# Patient Record
Sex: Female | Born: 1960 | Race: White | Hispanic: No | Smoking: Current every day smoker
Health system: Southern US, Community
[De-identification: ages and names within clinical notes are randomized; demographics above are authoritative.]

## PROBLEM LIST (undated history)

## (undated) DIAGNOSIS — E785 Hyperlipidemia, unspecified: Secondary | ICD-10-CM

## (undated) DIAGNOSIS — R112 Nausea with vomiting, unspecified: Secondary | ICD-10-CM

## (undated) DIAGNOSIS — T8859XA Other complications of anesthesia, initial encounter: Secondary | ICD-10-CM

## (undated) DIAGNOSIS — C4492 Squamous cell carcinoma of skin, unspecified: Secondary | ICD-10-CM

## (undated) DIAGNOSIS — R000 Tachycardia, unspecified: Secondary | ICD-10-CM

## (undated) DIAGNOSIS — D649 Anemia, unspecified: Secondary | ICD-10-CM

## (undated) DIAGNOSIS — I10 Essential (primary) hypertension: Secondary | ICD-10-CM

## (undated) DIAGNOSIS — Z9889 Other specified postprocedural states: Secondary | ICD-10-CM

## (undated) HISTORY — DX: Squamous cell carcinoma of skin, unspecified: C44.92

## (undated) HISTORY — DX: Hyperlipidemia, unspecified: E78.5

## (undated) HISTORY — DX: Essential (primary) hypertension: I10

## (undated) HISTORY — PX: BREAST BIOPSY: SHX20

---

## 2002-11-29 DIAGNOSIS — C439 Malignant melanoma of skin, unspecified: Secondary | ICD-10-CM | POA: Insufficient documentation

## 2004-07-26 ENCOUNTER — Other Ambulatory Visit: Admission: RE | Admit: 2004-07-26 | Discharge: 2004-07-26 | Payer: Self-pay | Admitting: Dermatology

## 2004-09-27 ENCOUNTER — Other Ambulatory Visit: Admission: RE | Admit: 2004-09-27 | Discharge: 2004-09-27 | Payer: Self-pay | Admitting: Dermatology

## 2005-04-10 ENCOUNTER — Emergency Department (HOSPITAL_COMMUNITY): Admission: EM | Admit: 2005-04-10 | Discharge: 2005-04-10 | Payer: Self-pay | Admitting: Emergency Medicine

## 2007-01-09 ENCOUNTER — Ambulatory Visit (HOSPITAL_COMMUNITY): Payer: Self-pay | Admitting: Oncology

## 2007-01-09 ENCOUNTER — Emergency Department (HOSPITAL_COMMUNITY): Admission: EM | Admit: 2007-01-09 | Discharge: 2007-01-09 | Payer: Self-pay | Admitting: Emergency Medicine

## 2007-03-01 ENCOUNTER — Ambulatory Visit (HOSPITAL_COMMUNITY): Payer: Self-pay | Admitting: Oncology

## 2007-03-01 ENCOUNTER — Encounter (HOSPITAL_COMMUNITY): Admission: RE | Admit: 2007-03-01 | Discharge: 2007-03-27 | Payer: Self-pay | Admitting: Oncology

## 2007-09-06 ENCOUNTER — Ambulatory Visit (HOSPITAL_COMMUNITY): Payer: Self-pay | Admitting: Oncology

## 2007-09-06 ENCOUNTER — Encounter (HOSPITAL_COMMUNITY): Admission: RE | Admit: 2007-09-06 | Discharge: 2007-10-06 | Payer: Self-pay | Admitting: Oncology

## 2011-03-21 LAB — CBC
MCHC: 34.8
Platelets: 241
RDW: 15.3

## 2011-04-08 LAB — FERRITIN: Ferritin: 17 (ref 10–291)

## 2011-04-08 LAB — CBC
MCHC: 32.7
MCV: 87.1
Platelets: 236
RBC: 4.32
WBC: 11 — ABNORMAL HIGH

## 2011-04-11 LAB — DIFFERENTIAL
Basophils Absolute: 0
Basophils Relative: 0
Eosinophils Absolute: 0.1
Eosinophils Relative: 2
Lymphocytes Relative: 18
Lymphs Abs: 1
Monocytes Absolute: 0.4
Monocytes Relative: 8
Neutro Abs: 4.1
Neutrophils Relative %: 72

## 2011-04-11 LAB — COMPREHENSIVE METABOLIC PANEL
ALT: 12
AST: 18
Albumin: 3.9
Alkaline Phosphatase: 48
BUN: 9
CO2: 22
Calcium: 8.9
Chloride: 108
Creatinine, Ser: 0.66
GFR calc Af Amer: >60
GFR calc non Af Amer: >60
Glucose, Bld: 100 — ABNORMAL HIGH
Potassium: 4.2
Sodium: 138
Total Bilirubin: 0.4
Total Protein: 6.8

## 2011-04-11 LAB — URINE MICROSCOPIC-ADD ON

## 2011-04-11 LAB — URINALYSIS, ROUTINE W REFLEX MICROSCOPIC
Bilirubin Urine: NEGATIVE
Glucose, UA: NEGATIVE
Ketones, ur: NEGATIVE
Leukocytes, UA: NEGATIVE
Nitrite: NEGATIVE
Specific Gravity, Urine: 1.02
Urobilinogen, UA: 0.2
pH: 6.5

## 2011-04-11 LAB — IRON AND TIBC
Iron: 10 — ABNORMAL LOW
Saturation Ratios: 2 — ABNORMAL LOW

## 2011-04-11 LAB — FERRITIN: Ferritin: 4 — ABNORMAL LOW (ref 10–291)

## 2011-04-11 LAB — PREGNANCY, URINE: Preg Test, Ur: NEGATIVE

## 2011-04-11 LAB — CBC
HCT: 28.5 — ABNORMAL LOW
Hemoglobin: 8.7 — ABNORMAL LOW
Platelets: 247
RDW: 18.4 — ABNORMAL HIGH
WBC: 5.6

## 2011-04-11 LAB — LACTATE DEHYDROGENASE: LDH: 112

## 2021-03-05 ENCOUNTER — Other Ambulatory Visit: Payer: Self-pay | Admitting: Oculoplastics Ophthalmology

## 2021-03-05 DIAGNOSIS — H04412 Chronic dacryocystitis of left lacrimal passage: Secondary | ICD-10-CM

## 2021-03-18 ENCOUNTER — Ambulatory Visit: Payer: Self-pay

## 2021-05-07 ENCOUNTER — Ambulatory Visit (HOSPITAL_COMMUNITY)
Admission: RE | Admit: 2021-05-07 | Discharge: 2021-05-07 | Disposition: A | Discharge status: Home / Self Care | Payer: Managed Care, Other (non HMO) | Source: Ambulatory Visit | Attending: Dermatology | Admitting: Dermatology

## 2021-05-07 ENCOUNTER — Other Ambulatory Visit (HOSPITAL_COMMUNITY)
Admission: RE | Admit: 2021-05-07 | Discharge: 2021-05-07 | Disposition: A | Discharge status: Home / Self Care | Payer: Managed Care, Other (non HMO) | Source: Ambulatory Visit | Attending: Dermatology | Admitting: Dermatology

## 2021-05-07 ENCOUNTER — Other Ambulatory Visit: Payer: Self-pay

## 2021-05-07 ENCOUNTER — Other Ambulatory Visit (HOSPITAL_COMMUNITY): Payer: Self-pay | Admitting: Dermatology

## 2021-05-07 DIAGNOSIS — C44320 Squamous cell carcinoma of skin of unspecified parts of face: Secondary | ICD-10-CM | POA: Insufficient documentation

## 2021-05-17 DIAGNOSIS — C44329 Squamous cell carcinoma of skin of other parts of face: Secondary | ICD-10-CM | POA: Insufficient documentation

## 2021-05-17 DIAGNOSIS — C50911 Malignant neoplasm of unspecified site of right female breast: Secondary | ICD-10-CM | POA: Insufficient documentation

## 2021-05-25 ENCOUNTER — Other Ambulatory Visit: Payer: Self-pay | Admitting: Hematology and Oncology

## 2021-05-25 DIAGNOSIS — C50011 Malignant neoplasm of nipple and areola, right female breast: Secondary | ICD-10-CM

## 2021-05-31 ENCOUNTER — Other Ambulatory Visit: Payer: Self-pay | Admitting: *Deleted

## 2021-05-31 DIAGNOSIS — C50911 Malignant neoplasm of unspecified site of right female breast: Secondary | ICD-10-CM

## 2021-06-09 ENCOUNTER — Other Ambulatory Visit: Payer: Self-pay | Admitting: Hematology and Oncology

## 2021-06-09 ENCOUNTER — Other Ambulatory Visit: Payer: Self-pay | Admitting: *Deleted

## 2021-06-09 DIAGNOSIS — R928 Other abnormal and inconclusive findings on diagnostic imaging of breast: Secondary | ICD-10-CM

## 2021-06-15 ENCOUNTER — Ambulatory Visit: Payer: Managed Care, Other (non HMO) | Admitting: General Surgery

## 2021-06-23 ENCOUNTER — Other Ambulatory Visit: Payer: Self-pay | Admitting: Hematology and Oncology

## 2021-06-23 ENCOUNTER — Ambulatory Visit
Admission: RE | Admit: 2021-06-23 | Discharge: 2021-06-23 | Disposition: A | Discharge status: Home / Self Care | Payer: Managed Care, Other (non HMO) | Source: Ambulatory Visit | Attending: Hematology and Oncology | Admitting: Hematology and Oncology

## 2021-06-23 DIAGNOSIS — R928 Other abnormal and inconclusive findings on diagnostic imaging of breast: Secondary | ICD-10-CM

## 2021-07-01 ENCOUNTER — Encounter: Payer: Self-pay | Admitting: General Surgery

## 2021-07-01 ENCOUNTER — Other Ambulatory Visit: Payer: Self-pay

## 2021-07-01 ENCOUNTER — Ambulatory Visit: Payer: Managed Care, Other (non HMO) | Admitting: General Surgery

## 2021-07-01 VITALS — BP 139/59 | HR 69 | Temp 98.9°F | Resp 18 | Ht 67.0 in | Wt 148.0 lb

## 2021-07-01 DIAGNOSIS — Z170 Estrogen receptor positive status [ER+]: Secondary | ICD-10-CM | POA: Diagnosis not present

## 2021-07-01 DIAGNOSIS — C50411 Malignant neoplasm of upper-outer quadrant of right female breast: Secondary | ICD-10-CM | POA: Diagnosis not present

## 2021-07-02 NOTE — Progress Notes (Signed)
Kristin Villa; [id number]; 22-Aug-1960   HPI Patient is a 61 year old white female who was referred to my care by [name] for evaluation treatment of a newly diagnosed right breast cancer.  Patient noticed a lump in her right breast and core biopsies of both the right breast mass and a right axillary lymph node were positive for invasive mammary carcinoma, ER, PR positive.  Patient has had multiple right breast biopsies in the past.  She denies any family history of breast cancer.  She denies any nipple discharge. Past Medical History:  Diagnosis Date   Hyperlipidemia    Hypertension    SCC (squamous cell carcinoma)    face    Past Surgical History:  Procedure Laterality Date   BREAST BIOPSY Right    x3    Family History  Problem Relation Age of Onset   Dementia Father    Cancer Maternal Grandmother        colon   Cancer Maternal Grandfather        lung    Current Outpatient Medications on File Prior to Visit  Medication Sig Dispense Refill   metoprolol succinate (TOPROL-XL) 25 MG 24 hr tablet Take 25 mg by mouth daily.     rosuvastatin (CRESTOR) 5 MG tablet Take by mouth.     No current facility-administered medications on file prior to visit.    No Known Allergies  Social History   Substance and Sexual Activity  Alcohol Use Never    Social History   Tobacco Use  Smoking Status Every Day   Types: Cigarettes  Smokeless Tobacco Never    Review of Systems  Constitutional: Negative.   HENT: Negative.    Eyes: Negative.   Respiratory: Negative.    Cardiovascular: Negative.   Gastrointestinal: Negative.   Genitourinary: Negative.   Musculoskeletal: Negative.   Skin: Negative.   Neurological: Negative.   Endo/Heme/Allergies: Negative.   Psychiatric/Behavioral: Negative.     Objective   Vitals:   07/01/21 1133  BP: (!) 139/59  Pulse: 69  Resp: 18  Temp: 98.9 F (37.2 C)  SpO2: 96%    Physical Exam Vitals reviewed. Exam conducted with a  chaperone present.  Constitutional:      Appearance: Normal appearance. She is normal weight. She is not ill-appearing.  HENT:     Head: Normocephalic and atraumatic.  Cardiovascular:     Rate and Rhythm: Normal rate and regular rhythm.     Heart sounds: Normal heart sounds. No murmur heard.   No friction rub. No gallop.  Pulmonary:     Effort: Pulmonary effort is normal. No respiratory distress.     Breath sounds: Normal breath sounds. No stridor. No wheezing, rhonchi or rales.  Lymphadenopathy:     Cervical: No cervical adenopathy.  Skin:    General: Skin is warm and dry.  Neurological:     Mental Status: She is alert and oriented to person, place, and time.  Breast: Dominant 2 cm mass in the upper outer quadrant of the right breast with another easily palpable mass in the lower portion of the right axilla.  There is bilateral nipple retraction.  A large tattoo is noted in the upper portion of the right breast.  The left breast is without a dominant mass, nipple discharge, or axillary lymphadenopathy. 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  Patient states she is already decided to undergo right modified radical mastectomy.  The risks and benefits of the procedure including bleeding, infection, cardiopulmonary difficulties, and right arm swelling were fully explained to the patient, who gave informed consent.  Patient is scheduled for 07/16/2021.

## 2021-07-02 NOTE — H&P (Signed)
Kristin Villa; [id number]; 1960-12-21   HPI Patient is a 61 year old white female who was referred to my care by [name] for evaluation treatment of a newly diagnosed right breast cancer.  Patient noticed a lump in her right breast and core biopsies of both the right breast mass and a right axillary lymph node were positive for invasive mammary carcinoma, ER, PR positive.  Patient has had multiple right breast biopsies in the past.  She denies any family history of breast cancer.  She denies any nipple discharge. Past Medical History:  Diagnosis Date   Hyperlipidemia    Hypertension    SCC (squamous cell carcinoma)    face    Past Surgical History:  Procedure Laterality Date   BREAST BIOPSY Right    x3    Family History  Problem Relation Age of Onset   Dementia Father    Cancer Maternal Grandmother        colon   Cancer Maternal Grandfather        lung    Current Outpatient Medications on File Prior to Visit  Medication Sig Dispense Refill   metoprolol succinate (TOPROL-XL) 25 MG 24 hr tablet Take 25 mg by mouth daily.     rosuvastatin (CRESTOR) 5 MG tablet Take by mouth.     No current facility-administered medications on file prior to visit.    No Known Allergies  Social History   Substance and Sexual Activity  Alcohol Use Never    Social History   Tobacco Use  Smoking Status Every Day   Types: Cigarettes  Smokeless Tobacco Never    Review of Systems  Constitutional: Negative.   HENT: Negative.    Eyes: Negative.   Respiratory: Negative.    Cardiovascular: Negative.   Gastrointestinal: Negative.   Genitourinary: Negative.   Musculoskeletal: Negative.   Skin: Negative.   Neurological: Negative.   Endo/Heme/Allergies: Negative.   Psychiatric/Behavioral: Negative.     Objective   Vitals:   07/01/21 1133  BP: (!) 139/59  Pulse: 69  Resp: 18  Temp: 98.9 F (37.2 C)  SpO2: 96%    Physical Exam Vitals reviewed. Exam conducted with a  chaperone present.  Constitutional:      Appearance: Normal appearance. She is normal weight. She is not ill-appearing.  HENT:     Head: Normocephalic and atraumatic.  Cardiovascular:     Rate and Rhythm: Normal rate and regular rhythm.     Heart sounds: Normal heart sounds. No murmur heard.   No friction rub. No gallop.  Pulmonary:     Effort: Pulmonary effort is normal. No respiratory distress.     Breath sounds: Normal breath sounds. No stridor. No wheezing, rhonchi or rales.  Lymphadenopathy:     Cervical: No cervical adenopathy.  Skin:    General: Skin is warm and dry.  Neurological:     Mental Status: She is alert and oriented to person, place, and time.  Breast: Dominant 2 cm mass in the upper outer quadrant of the right breast with another easily palpable mass in the lower portion of the right axilla.  There is bilateral nipple retraction.  A large tattoo is noted in the upper portion of the right breast.  The left breast is without a dominant mass, nipple discharge, or axillary lymphadenopathy. 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  Patient states she is already decided to undergo right modified radical mastectomy.  The risks and benefits of the procedure including bleeding, infection, cardiopulmonary difficulties, and right arm swelling were fully explained to the patient, who gave informed consent.  Patient is scheduled for 07/16/2021.

## 2021-07-08 ENCOUNTER — Telehealth: Payer: Self-pay | Admitting: *Deleted

## 2021-07-08 DIAGNOSIS — C50411 Malignant neoplasm of upper-outer quadrant of right female breast: Secondary | ICD-10-CM

## 2021-07-08 DIAGNOSIS — Z01818 Encounter for other preprocedural examination: Secondary | ICD-10-CM

## 2021-07-08 NOTE — Telephone Encounter (Signed)
Received call from patient.   Reports that she has pre- op appointment scheduled on 07/14/2021. States that she is currently employed by [employer] and is able to have labs drawn with no cost. Patient reports that labs done outside of [facility] are not covered by her insurance and are expensive. Advised patient that [facility] is not ordering labs at the pre-op appointment. Advised that labs are drawn per hospital and anesthesia protocol. Gave patient direct line to [facility] pre-op nurse.   Patient then returned call and was noted to be upset. States that she was advised that she cannot have her labs done by outside lab. Requested to have Dr. Arnoldo Morale call anesthesia to attempt to determine what labs will be required, and then order labs for her to obtain. Patient reports that if labs are not able to be obtained at [facility], she will have to cancel upcoming pre-op and surgery.  Call placed to [facility] pre-op nurse. Was advised that patient can have CBC with Diff and CMP with GFR done at lab outside of [facility] if Dr. Arnoldo Morale is agreeable to ordering. Patient will need to bring copy of labs to pre-op appointment. However, Type and Cross must be obtained at [facility]. Per [employer] protocol, type and cross must be performed at facility where blood products will be administered if needed. Patient will also require COVID test at [facility].   Call placed to patient to advise. [place].

## 2021-07-08 NOTE — Telephone Encounter (Addendum)
Patient returned call and made aware. Patient is agreeable to having Type and Cross at [facility] per [employer] protocol.   Ok to order CBC/ CMP at [facility] for patient?  If agreeable, patient requested to have lab req faxed to [phone].

## 2021-07-09 NOTE — Telephone Encounter (Signed)
Orders placed and faxed per patient preference.

## 2021-07-10 LAB — COMPREHENSIVE METABOLIC PANEL
ALT: 6 IU/L (ref 0–32)
AST: 14 IU/L (ref 0–40)
Albumin/Globulin Ratio: 2 (ref 1.2–2.2)
Albumin: 4.4 g/dL (ref 3.8–4.8)
Alkaline Phosphatase: 79 IU/L (ref 44–121)
BUN/Creatinine Ratio: 14 (ref 12–28)
BUN: 11 mg/dL (ref 8–27)
Bilirubin Total: 0.5 mg/dL (ref 0.0–1.2)
CO2: 26 mmol/L (ref 20–29)
Calcium: 9.2 mg/dL (ref 8.7–10.3)
Chloride: 106 mmol/L (ref 96–106)
Creatinine, Ser: 0.8 mg/dL (ref 0.57–1.00)
Globulin, Total: 2.2 g/dL (ref 1.5–4.5)
Glucose: 148 mg/dL — ABNORMAL HIGH (ref 70–99)
Potassium: 4.7 mmol/L (ref 3.5–5.2)
Sodium: 143 mmol/L (ref 134–144)
Total Protein: 6.6 g/dL (ref 6.0–8.5)
eGFR: 83 mL/min/{1.73_m2} (ref >59)

## 2021-07-10 LAB — CBC WITH DIFFERENTIAL/PLATELET
Basophils Absolute: 0 10*3/uL (ref 0.0–0.2)
Basos: 0 %
EOS (ABSOLUTE): 0.1 10*3/uL (ref 0.0–0.4)
Eos: 1 %
Hematocrit: 40.7 % (ref 34.0–46.6)
Hemoglobin: 14.1 g/dL (ref 11.1–15.9)
Immature Grans (Abs): 0 10*3/uL (ref 0.0–0.1)
Immature Granulocytes: 0 %
Lymphocytes Absolute: 2.9 10*3/uL (ref 0.7–3.1)
Lymphs: 34 %
MCH: 32.8 pg (ref 26.6–33.0)
MCHC: 34.6 g/dL (ref 31.5–35.7)
MCV: 95 fL (ref 79–97)
Monocytes Absolute: 0.5 10*3/uL (ref 0.1–0.9)
Monocytes: 6 %
Neutrophils Absolute: 5 10*3/uL (ref 1.4–7.0)
Neutrophils: 59 %
Platelets: 255 10*3/uL (ref 150–450)
RBC: 4.3 x10E6/uL (ref 3.77–5.28)
RDW: 11.8 % (ref 11.7–15.4)
WBC: 8.5 10*3/uL (ref 3.4–10.8)

## 2021-07-13 NOTE — Patient Instructions (Signed)
Kristin Villa  07/13/2021     @PREFPERIOPPHARMACY @   Your procedure is scheduled on  07/16/2021.   Report to [facility] at  0700 A.M.   Call this number if you have problems the morning of surgery:  [phone]   Remember:  Do not eat or drink after midnight.      Take these medicines the morning of surgery with A SIP OF WATER                       metoprolol, flexeril(if needed)     Do not wear jewelry, make-up or nail polish.  Do not wear lotions, powders, or perfumes, or deodorant.  Do not shave 48 hours prior to surgery.  Men may shave face and neck.  Do not bring valuables to the hospital.  [facility] is not responsible for any belongings or valuables.  Contacts, dentures or bridgework may not be worn into surgery.  Leave your suitcase in the car.  After surgery it may be brought to your room.  For patients admitted to the hospital, discharge time will be determined by your treatment team.  Patients discharged the day of surgery will not be allowed to drive home and must have someone with them for 24 hours.    Special instructions:   DO NOT smoke tobacco or vape for 24 hours before your procedure.  Please read over the following fact sheets that you were given. Pain Booklet, Coughing and Deep Breathing, Blood Transfusion Information, Surgical Site Infection Prevention, Anesthesia Post-op Instructions, and Care and Recovery After Surgery      Total or Modified Radical Mastectomy, Care After This sheet gives you information about how to care for yourself after your procedure. Your health care provider may also give you more specific instructions. If you have problems or questions, contact your health care provider. What can I expect after the procedure? After the procedure, it is common to have: Pain. Numbness. Stiffness in the arm or shoulder. Feelings of stress, sadness, or depression. If the lymph nodes under your arm were removed, you may  have arm swelling, weakness, or numbness on the same side of your body as your surgery. Follow these instructions at home: Incision care  Follow instructions from your health care provider about how to take care of your incision. Make sure you: Wash your hands with soap and water before you change your bandage (dressing). If soap and water are not available, use hand sanitizer. Change your dressing as told by your health care provider. Leave stitches (sutures), skin glue, or adhesive strips in place. These skin closures may need to stay in place for 2 weeks or longer. If adhesive strip edges start to loosen and curl up, you may trim the loose edges. Do not remove adhesive strips completely unless your health care provider tells you to do that. Check your incision area every day for signs of infection. Check for: Redness, swelling, or more pain. Fluid or blood. Warmth. Pus or a bad smell. If you were sent home with a surgical drain in place, follow instructions from your health care provider about emptying it. Bathing Do not take baths, swim, or use a hot tub until your health care provider approves. Ask your health care provider if you may take showers. You may only be allowed to take sponge baths. Activity  Return to your normal activities as told by your health care provider. Ask  your health care provider what activities are safe for you. Avoid activities that take a lot of effort. Be careful to avoid any activities that could cause an injury to your arm on the side of your surgery. Do not lift anything that is heavier than 10 lb (4.5 kg), or the limit that you are told, until your health care provider says that it is safe. Avoid lifting with the arm on the side of your surgery. Do not carry heavy objects on your shoulder. After your drain is removed, do exercises to prevent stiffness and swelling in your arm. Talk with your health care provider about which exercises are safe for  you. General instructions Take over-the-counter and prescription medicines only as told by your health care provider. You may eat what you usually do. Keep your arm raised (elevated) above the level of your heart when you are sitting or lying down. Do not wear tight jewelry on your arm, wrist, or fingers on the side of your surgery. You may be given a tight sleeve (compression bandage) to wear over your arm on the side of your surgery. Wear this sleeve as told by your health care provider. Ask your health care provider when you can start wearing a bra or using a breast prosthesis. Before you are involved in certain procedures such as giving blood or having your blood pressure checked, tell all your health care providers if lymph nodes under your arm were removed. This is important information. Follow-up Keep all follow-up visits as told by your health care provider. This is important. Get checked for extra fluid around your lymph nodes (lymphedema) as often as told by your health care provider. Contact a health care provider if: You have a fever. Your pain medicine is not working. Your arm swelling, weakness, or numbness has not improved after a few weeks. You have new swelling in your breast area or arm. You have redness, swelling, or more pain in your incision area. You have fluid or blood coming from your incision. Your incision feels warm to the touch. You have pus or a bad smell coming from your incision. Get help right away if: You have very bad pain in your breast area or arm. You have chest pain. You have difficulty breathing. Summary Follow instructions from your health care provider about how to take care of your incision. Check your incision area every day for signs of infection. Ask your health care provider what activities are safe for you. Keep all follow-up visits as told by your health care provider. This is important. Make sure you know which symptoms should cause you to  contact your health care provider or to get help right away. This information is not intended to replace advice given to you by your health care provider. Make sure you discuss any questions you have with your health care provider. Document Revised: 12/24/2019 Document Reviewed: 01/03/2020 Elsevier Patient Education  [place] Anesthesia, Adult, Care After This sheet gives you information about how to care for yourself after your procedure. Your health care provider may also give you more specific instructions. If you have problems or questions, contact your health care provider. What can I expect after the procedure? After the procedure, the following side effects are common: Pain or discomfort at the IV site. Nausea. Vomiting. Sore throat. Trouble concentrating. Feeling cold or chills. Feeling weak or tired. Sleepiness and fatigue. Soreness and body aches. These side effects can affect parts of the body that  were not involved in surgery. Follow these instructions at home: For the time period you were told by your health care provider:  Rest. Do not participate in activities where you could fall or become injured. Do not drive or use machinery. Do not drink alcohol. Do not take sleeping pills or medicines that cause drowsiness. Do not make important decisions or sign legal documents. Do not take care of children on your own. Eating and drinking Follow any instructions from your health care provider about eating or drinking restrictions. When you feel hungry, start by eating small amounts of foods that are soft and easy to digest (bland), such as toast. Gradually return to your regular diet. Drink enough fluid to keep your urine pale yellow. If you vomit, rehydrate by drinking water, juice, or clear broth. General instructions If you have sleep apnea, surgery and certain medicines can increase your risk for breathing problems. Follow instructions from your health  care provider about wearing your sleep device: Anytime you are sleeping, including during daytime naps. While taking prescription pain medicines, sleeping medicines, or medicines that make you drowsy. Have a responsible adult stay with you for the time you are told. It is important to have someone help care for you until you are awake and alert. Return to your normal activities as told by your health care provider. Ask your health care provider what activities are safe for you. Take over-the-counter and prescription medicines only as told by your health care provider. If you smoke, do not smoke without supervision. Keep all follow-up visits as told by your health care provider. This is important. Contact a health care provider if: You have nausea or vomiting that does not get better with medicine. You cannot eat or drink without vomiting. You have pain that does not get better with medicine. You are unable to pass urine. You develop a skin rash. You have a fever. You have redness around your IV site that gets worse. Get help right away if: You have difficulty breathing. You have chest pain. You have blood in your urine or stool, or you vomit blood. Summary After the procedure, it is common to have a sore throat or nausea. It is also common to feel tired. Have a responsible adult stay with you for the time you are told. It is important to have someone help care for you until you are awake and alert. When you feel hungry, start by eating small amounts of foods that are soft and easy to digest (bland), such as toast. Gradually return to your regular diet. Drink enough fluid to keep your urine pale yellow. Return to your normal activities as told by your health care provider. Ask your health care provider what activities are safe for you. This information is not intended to replace advice given to you by your health care provider. Make sure you discuss any questions you have with your health  care provider. Document Revised: 02/27/2020 Document Reviewed: 09/26/2019 Elsevier Patient Education  2022 [place]. How to Use Chlorhexidine for Bathing Chlorhexidine gluconate (CHG) is a germ-killing (antiseptic) solution that is used to clean the skin. It can get rid of the bacteria that normally live on the skin and can keep them away for about 24 hours. To clean your skin with CHG, you may be given: A CHG solution to use in the shower or as part of a sponge bath. A prepackaged cloth that contains CHG. Cleaning your skin with CHG may help lower the risk for  infection: While you are staying in the intensive care unit of the hospital. If you have a vascular access, such as a central line, to provide short-term or long-term access to your veins. If you have a catheter to drain urine from your bladder. If you are on a ventilator. A ventilator is a machine that helps you breathe by moving air in and out of your lungs. After surgery. What are the risks? Risks of using CHG include: A skin reaction. Hearing loss, if CHG gets in your ears and you have a perforated eardrum. Eye injury, if CHG gets in your eyes and is not rinsed out. The CHG product catching fire. Make sure that you avoid smoking and flames after applying CHG to your skin. Do not use CHG: If you have a chlorhexidine allergy or have previously reacted to chlorhexidine. On babies younger than 37 months of age. How to use CHG solution Use CHG only as told by your health care provider, and follow the instructions on the label. Use the full amount of CHG as directed. Usually, this is one bottle. During a shower Follow these steps when using CHG solution during a shower (unless your health care provider gives you different instructions): Start the shower. Use your normal soap and shampoo to wash your face and hair. Turn off the shower or move out of the shower stream. Pour the CHG onto a clean washcloth. Do not use any type of  brush or rough-edged sponge. Starting at your neck, lather your body down to your toes. Make sure you follow these instructions: If you will be having surgery, pay special attention to the part of your body where you will be having surgery. Scrub this area for at least 1 minute. Do not use CHG on your head or face. If the solution gets into your ears or eyes, rinse them well with water. Avoid your genital area. Avoid any areas of skin that have broken skin, cuts, or scrapes. Scrub your back and under your arms. Make sure to wash skin folds. Let the lather sit on your skin for 1-2 minutes or as long as told by your health care provider. Thoroughly rinse your entire body in the shower. Make sure that all body creases and crevices are rinsed well. Dry off with a clean towel. Do not put any substances on your body afterward--such as powder, lotion, or perfume--unless you are told to do so by your health care provider. Only use lotions that are recommended by the manufacturer. Put on clean clothes or pajamas. If it is the night before your surgery, sleep in clean sheets.  During a sponge bath Follow these steps when using CHG solution during a sponge bath (unless your health care provider gives you different instructions): Use your normal soap and shampoo to wash your face and hair. Pour the CHG onto a clean washcloth. Starting at your neck, lather your body down to your toes. Make sure you follow these instructions: If you will be having surgery, pay special attention to the part of your body where you will be having surgery. Scrub this area for at least 1 minute. Do not use CHG on your head or face. If the solution gets into your ears or eyes, rinse them well with water. Avoid your genital area. Avoid any areas of skin that have broken skin, cuts, or scrapes. Scrub your back and under your arms. Make sure to wash skin folds. Let the lather sit on your skin for 1-2 minutes  or as long as told by  your health care provider. Using a different clean, wet washcloth, thoroughly rinse your entire body. Make sure that all body creases and crevices are rinsed well. Dry off with a clean towel. Do not put any substances on your body afterward--such as powder, lotion, or perfume--unless you are told to do so by your health care provider. Only use lotions that are recommended by the manufacturer. Put on clean clothes or pajamas. If it is the night before your surgery, sleep in clean sheets. How to use CHG prepackaged cloths Only use CHG cloths as told by your health care provider, and follow the instructions on the label. Use the CHG cloth on clean, dry skin. Do not use the CHG cloth on your head or face unless your health care provider tells you to. When washing with the CHG cloth: Avoid your genital area. Avoid any areas of skin that have broken skin, cuts, or scrapes. Before surgery Follow these steps when using a CHG cloth to clean before surgery (unless your health care provider gives you different instructions): Using the CHG cloth, vigorously scrub the part of your body where you will be having surgery. Scrub using a back-and-forth motion for 3 minutes. The area on your body should be completely wet with CHG when you are done scrubbing. Do not rinse. Discard the cloth and let the area air-dry. Do not put any substances on the area afterward, such as powder, lotion, or perfume. Put on clean clothes or pajamas. If it is the night before your surgery, sleep in clean sheets.  For general bathing Follow these steps when using CHG cloths for general bathing (unless your health care provider gives you different instructions). Use a separate CHG cloth for each area of your body. Make sure you wash between any folds of skin and between your fingers and toes. Wash your body in the following order, switching to a new cloth after each step: The front of your neck, shoulders, and chest. Both of your  arms, under your arms, and your hands. Your stomach and groin area, avoiding the genitals. Your right leg and foot. Your left leg and foot. The back of your neck, your back, and your buttocks. Do not rinse. Discard the cloth and let the area air-dry. Do not put any substances on your body afterward--such as powder, lotion, or perfume--unless you are told to do so by your health care provider. Only use lotions that are recommended by the manufacturer. Put on clean clothes or pajamas. Contact a health care provider if: Your skin gets irritated after scrubbing. You have questions about using your solution or cloth. You swallow any chlorhexidine. Call your local poison control center (1-(503)758-2302 in the [place] Get help right away if: Your eyes itch badly, or they become very red or swollen. Your skin itches badly and is red or swollen. Your hearing changes. You have trouble seeing. You have swelling or tingling in your mouth or throat. You have trouble breathing. These symptoms may represent a serious problem that is an emergency. Do not wait to see if the symptoms will go away. Get medical help right away. Call your local emergency services (911 in the [place] Do not drive yourself to the hospital. Summary Chlorhexidine gluconate (CHG) is a germ-killing (antiseptic) solution that is used to clean the skin. Cleaning your skin with CHG may help to lower your risk for infection. You may be given CHG to use for bathing. It may be in  a bottle or in a prepackaged cloth to use on your skin. Carefully follow your health care provider's instructions and the instructions on the product label. Do not use CHG if you have a chlorhexidine allergy. Contact your health care provider if your skin gets irritated after scrubbing. This information is not intended to replace advice given to you by your health care provider. Make sure you discuss any questions you have with your health care provider. Document  Revised: 08/24/2020 Document Reviewed: 08/24/2020 Elsevier Patient Education  2022 [employer].

## 2021-07-14 ENCOUNTER — Other Ambulatory Visit (HOSPITAL_COMMUNITY)
Admission: RE | Admit: 2021-07-14 | Discharge: 2021-07-14 | Disposition: A | Discharge status: Home / Self Care | Payer: Managed Care, Other (non HMO) | Source: Ambulatory Visit | Attending: General Surgery | Admitting: General Surgery

## 2021-07-14 ENCOUNTER — Other Ambulatory Visit: Payer: Self-pay

## 2021-07-14 ENCOUNTER — Encounter (HOSPITAL_COMMUNITY): Payer: Self-pay

## 2021-07-14 ENCOUNTER — Encounter (HOSPITAL_COMMUNITY)
Admission: RE | Admit: 2021-07-14 | Discharge: 2021-07-14 | Disposition: A | Discharge status: Home / Self Care | Payer: Managed Care, Other (non HMO) | Source: Ambulatory Visit | Attending: General Surgery | Admitting: General Surgery

## 2021-07-14 VITALS — BP 126/63 | HR 76 | Temp 97.7°F | Ht 67.0 in | Wt 147.0 lb

## 2021-07-14 DIAGNOSIS — Z170 Estrogen receptor positive status [ER+]: Secondary | ICD-10-CM | POA: Insufficient documentation

## 2021-07-14 DIAGNOSIS — Z20822 Contact with and (suspected) exposure to covid-19: Secondary | ICD-10-CM | POA: Diagnosis not present

## 2021-07-14 DIAGNOSIS — Z01818 Encounter for other preprocedural examination: Secondary | ICD-10-CM | POA: Diagnosis present

## 2021-07-14 DIAGNOSIS — C50411 Malignant neoplasm of upper-outer quadrant of right female breast: Secondary | ICD-10-CM | POA: Diagnosis not present

## 2021-07-14 DIAGNOSIS — C50911 Malignant neoplasm of unspecified site of right female breast: Secondary | ICD-10-CM

## 2021-07-14 HISTORY — DX: Nausea with vomiting, unspecified: R11.2

## 2021-07-14 HISTORY — DX: Other complications of anesthesia, initial encounter: T88.59XA

## 2021-07-14 HISTORY — DX: Other specified postprocedural states: Z98.890

## 2021-07-14 LAB — TYPE AND SCREEN
ABO/RH(D): A POS
Antibody Screen: NEGATIVE

## 2021-07-14 LAB — SARS CORONAVIRUS 2 (TAT 6-24 HRS): SARS Coronavirus 2: NEGATIVE

## 2021-07-15 ENCOUNTER — Encounter: Payer: Self-pay | Admitting: Ophthalmology

## 2021-07-16 ENCOUNTER — Ambulatory Visit (HOSPITAL_COMMUNITY): Payer: Managed Care, Other (non HMO) | Admitting: Anesthesiology

## 2021-07-16 ENCOUNTER — Encounter (HOSPITAL_COMMUNITY): Payer: Self-pay | Admitting: General Surgery

## 2021-07-16 ENCOUNTER — Other Ambulatory Visit: Payer: Self-pay

## 2021-07-16 ENCOUNTER — Observation Stay (HOSPITAL_COMMUNITY)
Admission: RE | Admit: 2021-07-16 | Discharge: 2021-07-16 | Disposition: A | Discharge status: Home / Self Care | Payer: Managed Care, Other (non HMO) | Attending: General Surgery | Admitting: General Surgery

## 2021-07-16 ENCOUNTER — Encounter (HOSPITAL_COMMUNITY)
Admission: RE | Disposition: A | Discharge status: Home / Self Care | Payer: Self-pay | Source: Home / Self Care | Attending: General Surgery

## 2021-07-16 DIAGNOSIS — Z85828 Personal history of other malignant neoplasm of skin: Secondary | ICD-10-CM | POA: Diagnosis not present

## 2021-07-16 DIAGNOSIS — C50911 Malignant neoplasm of unspecified site of right female breast: Secondary | ICD-10-CM | POA: Diagnosis present

## 2021-07-16 DIAGNOSIS — I10 Essential (primary) hypertension: Secondary | ICD-10-CM | POA: Insufficient documentation

## 2021-07-16 DIAGNOSIS — F17210 Nicotine dependence, cigarettes, uncomplicated: Secondary | ICD-10-CM | POA: Diagnosis not present

## 2021-07-16 DIAGNOSIS — Z170 Estrogen receptor positive status [ER+]: Secondary | ICD-10-CM | POA: Insufficient documentation

## 2021-07-16 DIAGNOSIS — C773 Secondary and unspecified malignant neoplasm of axilla and upper limb lymph nodes: Secondary | ICD-10-CM | POA: Insufficient documentation

## 2021-07-16 DIAGNOSIS — Z79899 Other long term (current) drug therapy: Secondary | ICD-10-CM | POA: Diagnosis not present

## 2021-07-16 DIAGNOSIS — C50411 Malignant neoplasm of upper-outer quadrant of right female breast: Secondary | ICD-10-CM

## 2021-07-16 DIAGNOSIS — Z9011 Acquired absence of right breast and nipple: Secondary | ICD-10-CM

## 2021-07-16 HISTORY — PX: MASTECTOMY MODIFIED RADICAL: SHX5962

## 2021-07-16 LAB — ABO/RH: ABO/RH(D): A POS

## 2021-07-16 SURGERY — MASTECTOMY, MODIFIED RADICAL
Anesthesia: General | Site: Breast | Laterality: Right

## 2021-07-16 MED ORDER — DIPHENHYDRAMINE HCL 12.5 MG/5ML PO ELIX: 12.5000 mg | ORAL_SOLUTION | Freq: Four times a day (QID) | ORAL | Status: DC | PRN

## 2021-07-16 MED ORDER — KETOROLAC TROMETHAMINE 30 MG/ML IJ SOLN: 30.0000 mg | Freq: Four times a day (QID) | INTRAMUSCULAR | Status: DC | PRN

## 2021-07-16 MED ORDER — ENOXAPARIN SODIUM 40 MG/0.4ML IJ SOSY
40.0000 mg | PREFILLED_SYRINGE | Freq: Once | INTRAMUSCULAR | Status: AC
  Administered 2021-07-16: 40 mg via SUBCUTANEOUS
  Filled 2021-07-16: qty 0.4

## 2021-07-16 MED ORDER — CEFAZOLIN SODIUM-DEXTROSE 2-4 GM/100ML-% IV SOLN
2.0000 g | INTRAVENOUS | Status: AC
  Administered 2021-07-16: 2 g via INTRAVENOUS
  Filled 2021-07-16: qty 100

## 2021-07-16 MED ORDER — BUPIVACAINE LIPOSOME 1.3 % IJ SUSP
INTRAMUSCULAR | Status: DC | PRN
  Administered 2021-07-16: 20 mL

## 2021-07-16 MED ORDER — LACTATED RINGERS IV SOLN
INTRAVENOUS | Status: DC
  Administered 2021-07-16: 1000 mL via INTRAVENOUS

## 2021-07-16 MED ORDER — KETOROLAC TROMETHAMINE 30 MG/ML IJ SOLN: 30.0000 mg | Freq: Four times a day (QID) | INTRAMUSCULAR | Status: DC

## 2021-07-16 MED ORDER — HYDROCODONE-ACETAMINOPHEN 7.5-325 MG PO TABS: 1.0000 | ORAL_TABLET | Freq: Once | ORAL | Status: DC | PRN

## 2021-07-16 MED ORDER — HYDROMORPHONE HCL 1 MG/ML IJ SOLN: 1.0000 mg | INTRAMUSCULAR | Status: DC | PRN

## 2021-07-16 MED ORDER — HEMOSTATIC AGENTS (NO CHARGE) OPTIME
TOPICAL | Status: DC | PRN
  Administered 2021-07-16: 1

## 2021-07-16 MED ORDER — ORAL CARE MOUTH RINSE: 15.0000 mL | Freq: Once | OROMUCOSAL | Status: AC

## 2021-07-16 MED ORDER — FENTANYL CITRATE (PF) 100 MCG/2ML IJ SOLN
INTRAMUSCULAR | Status: AC
  Filled 2021-07-16: qty 2

## 2021-07-16 MED ORDER — ENOXAPARIN SODIUM 40 MG/0.4ML IJ SOSY: 40.0000 mg | PREFILLED_SYRINGE | INTRAMUSCULAR | Status: DC

## 2021-07-16 MED ORDER — POVIDONE-IODINE 10 % EX OINT
TOPICAL_OINTMENT | CUTANEOUS | Status: AC
  Filled 2021-07-16: qty 2

## 2021-07-16 MED ORDER — DIPHENHYDRAMINE HCL 50 MG/ML IJ SOLN: 12.5000 mg | Freq: Four times a day (QID) | INTRAMUSCULAR | Status: DC | PRN

## 2021-07-16 MED ORDER — ONDANSETRON 4 MG PO TBDP: 4.0000 mg | ORAL_TABLET | Freq: Four times a day (QID) | ORAL | Status: DC | PRN

## 2021-07-16 MED ORDER — BUPIVACAINE LIPOSOME 1.3 % IJ SUSP
INTRAMUSCULAR | Status: AC
  Filled 2021-07-16: qty 20

## 2021-07-16 MED ORDER — CYCLOBENZAPRINE HCL 10 MG PO TABS: 10.0000 mg | ORAL_TABLET | Freq: Three times a day (TID) | ORAL | Status: DC | PRN

## 2021-07-16 MED ORDER — CHLORHEXIDINE GLUCONATE CLOTH 2 % EX PADS: 6.0000 | MEDICATED_PAD | Freq: Once | CUTANEOUS | Status: DC

## 2021-07-16 MED ORDER — PROPOFOL 10 MG/ML IV BOLUS
INTRAVENOUS | Status: DC | PRN
  Administered 2021-07-16: 100 mg via INTRAVENOUS
  Administered 2021-07-16: 50 mg via INTRAVENOUS
  Administered 2021-07-16: 150 mg via INTRAVENOUS

## 2021-07-16 MED ORDER — LIDOCAINE HCL (CARDIAC) PF 100 MG/5ML IV SOSY
PREFILLED_SYRINGE | INTRAVENOUS | Status: DC | PRN
Start: 2021-07-16 — End: 2021-07-16
  Administered 2021-07-16: 80 mg via INTRATRACHEAL

## 2021-07-16 MED ORDER — SODIUM CHLORIDE 0.9 % IV SOLN: INTRAVENOUS | Status: DC

## 2021-07-16 MED ORDER — ONDANSETRON HCL 4 MG/2ML IJ SOLN: 4.0000 mg | Freq: Four times a day (QID) | INTRAMUSCULAR | Status: DC | PRN

## 2021-07-16 MED ORDER — ONDANSETRON HCL 4 MG/2ML IJ SOLN: 4.0000 mg | Freq: Once | INTRAMUSCULAR | Status: DC | PRN

## 2021-07-16 MED ORDER — CHLORHEXIDINE GLUCONATE 0.12 % MT SOLN
15.0000 mL | Freq: Once | OROMUCOSAL | Status: AC
  Administered 2021-07-16: 15 mL via OROMUCOSAL

## 2021-07-16 MED ORDER — PROPOFOL 500 MG/50ML IV EMUL
INTRAVENOUS | Status: DC | PRN
Start: 2021-07-16 — End: 2021-07-16
  Administered 2021-07-16: 25 ug/kg/min via INTRAVENOUS

## 2021-07-16 MED ORDER — HYDROMORPHONE HCL 1 MG/ML IJ SOLN: 0.2500 mg | INTRAMUSCULAR | Status: DC | PRN

## 2021-07-16 MED ORDER — PHENYLEPHRINE HCL (PRESSORS) 10 MG/ML IV SOLN
INTRAVENOUS | Status: DC | PRN
  Administered 2021-07-16: 120 ug via INTRAVENOUS

## 2021-07-16 MED ORDER — FENTANYL CITRATE (PF) 100 MCG/2ML IJ SOLN
INTRAMUSCULAR | Status: DC | PRN
  Administered 2021-07-16 (×4): 50 ug via INTRAVENOUS

## 2021-07-16 MED ORDER — HYDROCODONE-ACETAMINOPHEN 5-325 MG PO TABS: 1.0000 | ORAL_TABLET | ORAL | Status: DC | PRN

## 2021-07-16 MED ORDER — ROSUVASTATIN CALCIUM 10 MG PO TABS: 5.0000 mg | ORAL_TABLET | Freq: Every day | ORAL | Status: DC

## 2021-07-16 MED ORDER — POVIDONE-IODINE 10 % OINT PACKET
TOPICAL_OINTMENT | CUTANEOUS | Status: DC | PRN
  Administered 2021-07-16: 2 via TOPICAL

## 2021-07-16 MED ORDER — METOPROLOL SUCCINATE ER 25 MG PO TB24: 25.0000 mg | ORAL_TABLET | Freq: Every day | ORAL | Status: DC

## 2021-07-16 MED ORDER — ACETAMINOPHEN 325 MG PO TABS: 650.0000 mg | ORAL_TABLET | Freq: Four times a day (QID) | ORAL | Status: DC | PRN

## 2021-07-16 MED ORDER — DEXAMETHASONE SODIUM PHOSPHATE 4 MG/ML IJ SOLN
INTRAMUSCULAR | Status: DC | PRN
  Administered 2021-07-16: 8 mg via INTRAVENOUS

## 2021-07-16 MED ORDER — ACETAMINOPHEN 650 MG RE SUPP: 650.0000 mg | Freq: Four times a day (QID) | RECTAL | Status: DC | PRN

## 2021-07-16 MED ORDER — PHENYLEPHRINE HCL (PRESSORS) 10 MG/ML IV SOLN
INTRAVENOUS | Status: AC
  Filled 2021-07-16: qty 1

## 2021-07-16 MED ORDER — LORAZEPAM 2 MG/ML IJ SOLN: 1.0000 mg | INTRAMUSCULAR | Status: DC | PRN

## 2021-07-16 MED ORDER — ONDANSETRON HCL 4 MG/2ML IJ SOLN
INTRAMUSCULAR | Status: DC | PRN
Start: 2021-07-16 — End: 2021-07-16
  Administered 2021-07-16: 4 mg via INTRAVENOUS

## 2021-07-16 MED ORDER — 0.9 % SODIUM CHLORIDE (POUR BTL) OPTIME
TOPICAL | Status: DC | PRN
  Administered 2021-07-16: 1000 mL

## 2021-07-16 MED ORDER — SIMETHICONE 80 MG PO CHEW: 40.0000 mg | CHEWABLE_TABLET | Freq: Four times a day (QID) | ORAL | Status: DC | PRN

## 2021-07-16 MED ORDER — PROPOFOL 10 MG/ML IV BOLUS
INTRAVENOUS | Status: AC
  Filled 2021-07-16: qty 20

## 2021-07-16 SURGICAL SUPPLY — 42 items
APL PRP STRL LF DISP 70% ISPRP (MISCELLANEOUS) ×1
APPLIER CLIP 11 MED OPEN (CLIP) ×2
APR CLP MED 11 20 MLT OPN (CLIP) ×1
BINDER BREAST LRG (GAUZE/BANDAGES/DRESSINGS) ×1 IMPLANT
CHLORAPREP W/TINT 26 (MISCELLANEOUS) ×3 IMPLANT
CLIP APPLIE 11 MED OPEN (CLIP) IMPLANT
CLOTH BEACON ORANGE TIMEOUT ST (SAFETY) ×3 IMPLANT
COVER LIGHT HANDLE STERIS (MISCELLANEOUS) ×6 IMPLANT
DRAPE HALF SHEET 40X57 (DRAPES) ×6 IMPLANT
ELECT REM PT RETURN 9FT ADLT (ELECTROSURGICAL) ×2
ELECTRODE REM PT RTRN 9FT ADLT (ELECTROSURGICAL) ×2 IMPLANT
EVACUATOR DRAINAGE 10X20 100CC (DRAIN) ×2 IMPLANT
EVACUATOR SILICONE 100CC (DRAIN) ×2
GAUZE SPONGE 4X4 12PLY STRL (GAUZE/BANDAGES/DRESSINGS) ×3 IMPLANT
GLOVE SURG POLYISO LF SZ7.5 (GLOVE) ×3 IMPLANT
GLOVE SURG UNDER POLY LF SZ7 (GLOVE) ×10 IMPLANT
GOWN STRL REUS W/TWL LRG LVL3 (GOWN DISPOSABLE) ×10 IMPLANT
HEMOSTAT ARISTA ABSORB 1G (HEMOSTASIS) ×1 IMPLANT
INST SET MINOR GENERAL (KITS) ×3 IMPLANT
KIT TURNOVER KIT A (KITS) ×3 IMPLANT
MANIFOLD NEPTUNE II (INSTRUMENTS) ×3 IMPLANT
NEEDLE 22X1 1/2 (OR ONLY) (NEEDLE) ×3 IMPLANT
NS IRRIG 1000ML POUR BTL (IV SOLUTION) ×3 IMPLANT
PACK MINOR (CUSTOM PROCEDURE TRAY) ×3 IMPLANT
PAD ABD 8X10 STRL (GAUZE/BANDAGES/DRESSINGS) ×3 IMPLANT
PAD ARMBOARD 7.5X6 YLW CONV (MISCELLANEOUS) ×3 IMPLANT
PENCIL SMOKE EVACUATOR (MISCELLANEOUS) ×3 IMPLANT
SET BASIN LINEN APH (SET/KITS/TRAYS/PACK) ×3 IMPLANT
SPONGE DRAIN TRACH 4X4 STRL 2S (GAUZE/BANDAGES/DRESSINGS) ×3 IMPLANT
SPONGE GAUZE 4X4 12PLY (GAUZE/BANDAGES/DRESSINGS) ×1 IMPLANT
SPONGE INTESTINAL PEANUT (DISPOSABLE) ×3 IMPLANT
SPONGE T-LAP 18X18 ~~LOC~~+RFID (SPONGE) ×6 IMPLANT
STAPLER VISISTAT (STAPLE) ×3 IMPLANT
SUT SILK 2 0 (SUTURE) ×2
SUT SILK 2 0 SH (SUTURE) ×3 IMPLANT
SUT SILK 2-0 18XBRD TIE 12 (SUTURE) ×2 IMPLANT
SUT VIC AB 2-0 CT1 27 (SUTURE) ×14
SUT VIC AB 2-0 CT1 TAPERPNT 27 (SUTURE) ×8 IMPLANT
SUT VIC AB 3-0 SH 27 (SUTURE) ×2
SUT VIC AB 3-0 SH 27X BRD (SUTURE) ×2 IMPLANT
SYR 20ML LL LF (SYRINGE) ×5 IMPLANT
SYR BULB IRRIG 60ML STRL (SYRINGE) ×3 IMPLANT

## 2021-07-16 NOTE — Anesthesia Preprocedure Evaluation (Signed)
Anesthesia Evaluation  Patient identified by MRN, date of birth, ID band Patient awake    Reviewed: Allergy & Precautions, NPO status , Patient's Chart, lab work & pertinent test results  History of Anesthesia Complications (+) PONV and history of anesthetic complications  Airway Mallampati: I       Dental no notable dental hx. (+) Poor Dentition   Pulmonary neg pulmonary ROS, Current Smoker and Patient abstained from smoking.,           Cardiovascular Exercise Tolerance: Good negative cardio ROS Normal cardiovascular exam     Neuro/Psych negative neurological ROS     GI/Hepatic negative GI ROS, Neg liver ROS,   Endo/Other  negative endocrine ROS  Renal/GU negative Renal ROS     Musculoskeletal negative musculoskeletal ROS (+)   Abdominal   Peds  Hematology negative hematology ROS (+)   Anesthesia Other Findings   Reproductive/Obstetrics                             Anesthesia Physical Anesthesia Plan  ASA: 2  Anesthesia Plan: General   Post-op Pain Management: Dilaudid IV   Induction: Intravenous  PONV Risk Score and Plan: 2 and Dexamethasone and Ondansetron  Airway Management Planned: LMA  Additional Equipment:   Intra-op Plan:   Post-operative Plan: Extubation in OR  Informed Consent: I have reviewed the patients History and Physical, chart, labs and discussed the procedure including the risks, benefits and alternatives for the proposed anesthesia with the patient or authorized representative who has indicated his/her understanding and acceptance.     Dental advisory given  Plan Discussed with:   Anesthesia Plan Comments:         Anesthesia Quick Evaluation

## 2021-07-16 NOTE — Anesthesia Procedure Notes (Signed)
Procedure Name: LMA Insertion Date/Time: 07/16/2021 9:05 AM Performed by: Minerva Ends, CRNA Pre-anesthesia Checklist: Patient identified, Suction available, Emergency Drugs available and Patient being monitored Patient Re-evaluated:Patient Re-evaluated prior to induction Oxygen Delivery Method: Circle system utilized Preoxygenation: Pre-oxygenation with 100% oxygen Induction Type: IV induction LMA: LMA inserted LMA Size: 3.0 Tube type: Oral Number of attempts: 1 Placement Confirmation: positive ETCO2 and breath sounds checked- equal and bilateral Tube secured with: Tape Dental Injury: Teeth and Oropharynx as per pre-operative assessment

## 2021-07-16 NOTE — Anesthesia Postprocedure Evaluation (Signed)
Anesthesia Post Note  Patient: Kristin Villa  Procedure(s) Performed: MASTECTOMY MODIFIED RADICAL (Right: Breast)  Patient location during evaluation: PACU Anesthesia Type: General Level of consciousness: awake and alert Pain management: pain level controlled Vital Signs Assessment: post-procedure vital signs reviewed and stable Respiratory status: spontaneous breathing, nonlabored ventilation, respiratory function stable and patient connected to nasal cannula oxygen Cardiovascular status: blood pressure returned to baseline and stable Postop Assessment: no apparent nausea or vomiting Anesthetic complications: no   No notable events documented.   Last Vitals:  Vitals:   07/16/21 1024 07/16/21 1358  BP: (!) 151/89 139/75  Pulse: 93 75  Resp: 14 16  Temp: 36.8 C 36.8 C  SpO2: 94% 98%    Last Pain:  Vitals:   07/16/21 1358  TempSrc: Oral  PainSc:                  Trixie Rude

## 2021-07-16 NOTE — Interval H&P Note (Signed)
History and Physical Interval Note:  07/16/2021 8:24 AM  Kristin Villa  has presented today for surgery, with the diagnosis of R Breast Cancer.  The various methods of treatment have been discussed with the patient and family. After consideration of risks, benefits and other options for treatment, the patient has consented to  Procedure(s): MASTECTOMY MODIFIED RADICAL (Right) as a surgical intervention.  The patient's history has been reviewed, patient examined, no change in status, stable for surgery.  I have reviewed the patient's chart and labs.  Questions were answered to the patient's satisfaction.     Aviva Signs

## 2021-07-16 NOTE — Transfer of Care (Signed)
Immediate Anesthesia Transfer of Care Note  Patient: Kristin Villa  Procedure(s) Performed: MASTECTOMY MODIFIED RADICAL (Right: Breast)  Patient Location: PACU  Anesthesia Type:General  Level of Consciousness: awake, alert  and oriented  Airway & Oxygen Therapy: Patient Spontanous Breathing  Post-op Assessment: Report given to RN and Post -op Vital signs reviewed and stable  Post vital signs: Reviewed and stable  Last Vitals:  Vitals Value Taken Time  BP 151/89 07/16/21 1024  Temp    Pulse 96 07/16/21 1025  Resp    SpO2 96 % 07/16/21 1025  Vitals shown include unvalidated device data.  Last Pain:  Vitals:   07/16/21 0802  TempSrc: Oral  PainSc: 0-No pain      Patients Stated Pain Goal: 8 (74/[id number])  Complications: No notable events documented.

## 2021-07-16 NOTE — Progress Notes (Signed)
Pt voiced wishes to leave AMA. DR.Jenkins made aware of pt wishes. Advised pt about leaving AMA. Pt Refused to sign AMA paper. IV removed. Educated  pt on how to empty JP drain.

## 2021-07-16 NOTE — Op Note (Signed)
Patient:  Kristin Villa  DOB:  18-Sep-1960  MRN:  [id number]   Preop Diagnosis: Right breast carcinoma  Postop Diagnosis: Same  Procedure: Right modified radical mastectomy  Surgeon: Aviva Signs, MD  Assistant:  Steffanie Dunn, DO  Anes: General endotracheal  Indications: Patient is a 61 year old white female recently diagnosed with right breast carcinoma, ER/PR positive with metastatic disease to the right axilla who presents for right modified radical mastectomy.  The risks and benefits of the procedure including bleeding, infection, cardiopulmonary difficulties, and right arm swelling were fully explained to the patient, who gave informed consent.  Procedure note: The patient was placed in the supine position.  After induction of general endotracheal anesthesia, the right breast and axilla were prepped and draped using the usual sterile technique with ChloraPrep.  Surgical site confirmation was performed.  An elliptical incision was made around the right nipple.  A superior flap was formed to the clavicle and an inferior flap formed to the chest wall.  The breast was then removed medial to lateral from the pectoralis major muscle using Bovie electrocautery.  A suture was placed superiorly for orientation purposes.  The right breast was then removed from the operative field.  A right axillary dissection was performed.  The thoracodorsal artery vein and nerve were identified.  2 palpable lymph nodes were noted in the right axilla and these were removed without difficulty.  Additional adipose tissue was removed from the right axilla up to the axillary vein.  A bleeding was controlled using clips.  The wound was then copiously irrigated with normal saline.  Arista was placed into the axilla.  A #10 flat Jackson-Pratt drain was placed along the flap and right axilla and exited the tissue inferior to the incision line.  It was secured in the place using a 3-0 nylon interrupted suture.  The  subcutaneous layer was reapproximated using a 2-0 Vicryl interrupted suture.  Exparel was instilled into the surrounding wound.  The skin was closed with staples.  Betadine ointment and dry sterile dressing were applied.  All tape and needle counts were correct at the end of the procedure.  The patient was extubated in the operating room and transferred to PACU in stable condition.  Complications: None  EBL: 100 cc  Specimen: Right breast, right axillary nodes  Drains: Jackson-Pratt drain to right mastectomy flap and axilla

## 2021-07-19 ENCOUNTER — Encounter (HOSPITAL_COMMUNITY): Payer: Self-pay | Admitting: General Surgery

## 2021-07-20 LAB — SURGICAL PATHOLOGY

## 2021-07-20 NOTE — Discharge Summary (Signed)
Physician Discharge Summary  Patient ID: Kristin Villa MRN: [id number] DOB/AGE: 1961-06-14 61 y.o.  Admit date: 07/16/2021 Discharge date: 07/16/2021  Admission Diagnoses: Right breast carcinoma, right modified radical mastectomy  Discharge Diagnoses: Same Principal Problem:   S/P mastectomy, right   Discharged Condition: good  Hospital Course: Patient is a 61 year old white female with right breast carcinoma who underwent a right modified radical mastectomy on 07/16/2021.  She tolerated the procedure well.  Her postoperative course was unremarkable.  She decided that she wanted to go home and left AMA.  Treatments: surgery: Right modified radical mastectomy on 07/16/2021  Discharge Exam: Blood pressure 139/75, pulse 75, temperature 98.2 F (36.8 C), temperature source Oral, resp. rate 16, height 5\' 7"  (1.702 m), weight 66.6 kg, SpO2 98 %. General appearance: alert, cooperative, and no distress  Disposition:   Home   Allergies as of 07/16/2021   No Known Allergies      Medication List     ASK your doctor about these medications    cyclobenzaprine 10 MG tablet Commonly known as: FLEXERIL Take 10 mg by mouth 3 (three) times daily as needed for muscle spasms.   metoprolol succinate 25 MG 24 hr tablet Commonly known as: TOPROL-XL Take 25 mg by mouth daily.   rosuvastatin 5 MG tablet Commonly known as: CRESTOR Take 5 mg by mouth daily.         Signed: Aviva Signs 07/20/2021, 8:47 AM

## 2021-07-22 ENCOUNTER — Encounter: Payer: Self-pay | Admitting: General Surgery

## 2021-07-22 ENCOUNTER — Ambulatory Visit (INDEPENDENT_AMBULATORY_CARE_PROVIDER_SITE_OTHER): Payer: Managed Care, Other (non HMO) | Admitting: General Surgery

## 2021-07-22 ENCOUNTER — Other Ambulatory Visit: Payer: Self-pay

## 2021-07-22 VITALS — BP 123/76 | HR 86 | Temp 97.5°F | Resp 16 | Ht 67.0 in | Wt 149.0 lb

## 2021-07-22 DIAGNOSIS — Z09 Encounter for follow-up examination after completed treatment for conditions other than malignant neoplasm: Secondary | ICD-10-CM

## 2021-07-22 NOTE — Progress Notes (Signed)
Subjective:     Kristin Villa  Patient here for postoperative visit, status post right modified radical mastectomy.  She is doing well.  She has had ongoing sanguinous drainage from her JP drain.  She did bruise but it is resolving.  She has no pain. Objective:    BP 123/76    Pulse 86    Temp (!) 97.5 F (36.4 C) (Other (Comment))    Resp 16    Ht '5\' 7"'  (1.702 m)    Wt 149 lb (67.6 kg)    SpO2 97%    BMI 23.34 kg/m   General:  alert, cooperative, and no distress  Right mastectomy incision clean and dry.  One half of staples removed.  There is some ecchymosis throughout the mastectomy site.  JP drainage with serosanguineous drainage.  Is having approximately 60 to 70 cc of drainage daily. 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  1 lymph node was found and was negative.  It is ER/PR positive, HER2 negative.  Patient aware of diagnosis.T1c, N0, M0     Assessment:    Doing well postoperatively.   Origninal work-up revealed a positive lymph node, but this was not found at time of surgery.   Plan:   Patient will follow-up here in 1 week to reassess the wound and the JP drain.

## 2021-07-23 ENCOUNTER — Telehealth: Payer: Self-pay | Admitting: Family Medicine

## 2021-07-23 NOTE — Discharge Instructions (Signed)

## 2021-07-23 NOTE — Telephone Encounter (Signed)
Received paperwork request from [employer] for copy of operative report and confirmation form of surgery. Paperwork filled out and faxed back with confirmation to [phone].  Patient is out of work from 07/16/2021 through 08/09/2021.

## 2021-07-27 ENCOUNTER — Ambulatory Visit: Payer: Managed Care, Other (non HMO) | Admitting: Anesthesiology

## 2021-07-27 ENCOUNTER — Ambulatory Visit
Admission: RE | Admit: 2021-07-27 | Discharge: 2021-07-27 | Disposition: A | Discharge status: Home / Self Care | Payer: Managed Care, Other (non HMO) | Attending: Ophthalmology | Admitting: Ophthalmology

## 2021-07-27 ENCOUNTER — Encounter: Payer: Self-pay | Admitting: Ophthalmology

## 2021-07-27 ENCOUNTER — Other Ambulatory Visit: Payer: Self-pay

## 2021-07-27 ENCOUNTER — Encounter
Admission: RE | Disposition: A | Discharge status: Home / Self Care | Payer: Self-pay | Source: Home / Self Care | Attending: Ophthalmology

## 2021-07-27 DIAGNOSIS — F17210 Nicotine dependence, cigarettes, uncomplicated: Secondary | ICD-10-CM | POA: Insufficient documentation

## 2021-07-27 DIAGNOSIS — E785 Hyperlipidemia, unspecified: Secondary | ICD-10-CM | POA: Diagnosis not present

## 2021-07-27 DIAGNOSIS — H2511 Age-related nuclear cataract, right eye: Secondary | ICD-10-CM | POA: Insufficient documentation

## 2021-07-27 HISTORY — PX: CATARACT EXTRACTION W/PHACO: SHX586

## 2021-07-27 HISTORY — DX: Tachycardia, unspecified: R00.0

## 2021-07-27 SURGERY — PHACOEMULSIFICATION, CATARACT, WITH IOL INSERTION
Anesthesia: Monitor Anesthesia Care | Site: Eye | Laterality: Right

## 2021-07-27 MED ORDER — ACETAMINOPHEN 160 MG/5ML PO SOLN: 325.0000 mg | ORAL | Status: DC | PRN

## 2021-07-27 MED ORDER — ARMC OPHTHALMIC DILATING DROPS
1.0000 "application " | OPHTHALMIC | Status: DC | PRN
  Administered 2021-07-27 (×3): 1 via OPHTHALMIC

## 2021-07-27 MED ORDER — SIGHTPATH DOSE#1 NA CHONDROIT SULF-NA HYALURON 40-17 MG/ML IO SOLN
INTRAOCULAR | Status: DC | PRN
  Administered 2021-07-27: 1 mL via INTRAOCULAR

## 2021-07-27 MED ORDER — TETRACAINE HCL 0.5 % OP SOLN
1.0000 [drp] | OPHTHALMIC | Status: DC | PRN
  Administered 2021-07-27 (×3): 1 [drp] via OPHTHALMIC

## 2021-07-27 MED ORDER — FENTANYL CITRATE (PF) 100 MCG/2ML IJ SOLN
INTRAMUSCULAR | Status: DC | PRN
  Administered 2021-07-27 (×2): 50 ug via INTRAVENOUS

## 2021-07-27 MED ORDER — SIGHTPATH DOSE#1 BSS IO SOLN
INTRAOCULAR | Status: DC | PRN
  Administered 2021-07-27: 51 mL via OPHTHALMIC

## 2021-07-27 MED ORDER — ONDANSETRON HCL 4 MG/2ML IJ SOLN: 4.0000 mg | Freq: Once | INTRAMUSCULAR | Status: DC | PRN

## 2021-07-27 MED ORDER — ACETAMINOPHEN 325 MG PO TABS: 325.0000 mg | ORAL_TABLET | ORAL | Status: DC | PRN

## 2021-07-27 MED ORDER — SIGHTPATH DOSE#1 BSS IO SOLN
INTRAOCULAR | Status: DC | PRN
  Administered 2021-07-27: 1 mL via INTRAMUSCULAR

## 2021-07-27 MED ORDER — MOXIFLOXACIN HCL 0.5 % OP SOLN
OPHTHALMIC | Status: DC | PRN
  Administered 2021-07-27: 0.2 mL via OPHTHALMIC

## 2021-07-27 MED ORDER — SIGHTPATH DOSE#1 BSS IO SOLN
INTRAOCULAR | Status: DC | PRN
  Administered 2021-07-27: 15 mL

## 2021-07-27 MED ORDER — BRIMONIDINE TARTRATE-TIMOLOL 0.2-0.5 % OP SOLN
OPHTHALMIC | Status: DC | PRN
  Administered 2021-07-27: 1 [drp] via OPHTHALMIC

## 2021-07-27 MED ORDER — MIDAZOLAM HCL 2 MG/2ML IJ SOLN
INTRAMUSCULAR | Status: DC | PRN
Start: 2021-07-27 — End: 2021-07-27
  Administered 2021-07-27: 2 mg via INTRAVENOUS

## 2021-07-27 SURGICAL SUPPLY — 10 items
CATARACT SUITE SIGHTPATH (MISCELLANEOUS) ×2 IMPLANT
FEE CATARACT SUITE SIGHTPATH (MISCELLANEOUS) ×2 IMPLANT
GLOVE SURG ENC TEXT LTX SZ8 (GLOVE) ×3 IMPLANT
GLOVE SURG TRIUMPH 8.0 PF LTX (GLOVE) ×3 IMPLANT
LENS IOL TECNIS EYHANCE 23.0 (Intraocular Lens) ×1 IMPLANT
NDL FILTER BLUNT 18X1 1/2 (NEEDLE) ×2 IMPLANT
NEEDLE FILTER BLUNT 18X 1/2SAF (NEEDLE) ×1
NEEDLE FILTER BLUNT 18X1 1/2 (NEEDLE) ×1 IMPLANT
SYR 3ML LL SCALE MARK (SYRINGE) ×3 IMPLANT
WATER STERILE IRR 250ML POUR (IV SOLUTION) ×3 IMPLANT

## 2021-07-27 NOTE — Anesthesia Postprocedure Evaluation (Signed)
Anesthesia Post Note  Patient: Kristin Villa  Procedure(s) Performed: CATARACT EXTRACTION PHACO AND INTRAOCULAR LENS PLACEMENT (IOC) RIGHT 7.43 00:35.1 (Right: Eye)     Patient location during evaluation: PACU Anesthesia Type: MAC Level of consciousness: awake Pain management: pain level controlled Vital Signs Assessment: post-procedure vital signs reviewed and stable Respiratory status: respiratory function stable Cardiovascular status: stable Postop Assessment: no signs of nausea or vomiting Anesthetic complications: no   No notable events documented.  Veda Canning

## 2021-07-27 NOTE — Anesthesia Preprocedure Evaluation (Signed)
Anesthesia Evaluation  Patient identified by MRN, date of birth, ID band Patient awake    Reviewed: Allergy & Precautions, NPO status   History of Anesthesia Complications (+) PONV  Airway Mallampati: II  TM Distance: >3 FB     Dental   Pulmonary Current Smoker and Patient abstained from smoking.,    Pulmonary exam normal        Cardiovascular  Rhythm:Regular Rate:Normal  HLD   Neuro/Psych    GI/Hepatic   Endo/Other    Renal/GU      Musculoskeletal   Abdominal   Peds  Hematology   Anesthesia Other Findings Breast cancer - s/p modified radical mastectomy 07/16/21  Reproductive/Obstetrics                             Anesthesia Physical Anesthesia Plan  ASA: 2  Anesthesia Plan: MAC   Post-op Pain Management: Minimal or no pain anticipated   Induction: Intravenous  PONV Risk Score and Plan: 3 and TIVA, Midazolam and Treatment may vary due to age or medical condition  Airway Management Planned: Natural Airway and Nasal Cannula  Additional Equipment:   Intra-op Plan:   Post-operative Plan:   Informed Consent: I have reviewed the patients History and Physical, chart, labs and discussed the procedure including the risks, benefits and alternatives for the proposed anesthesia with the patient or authorized representative who has indicated his/her understanding and acceptance.       Plan Discussed with: CRNA  Anesthesia Plan Comments:         Anesthesia Quick Evaluation

## 2021-07-27 NOTE — Anesthesia Procedure Notes (Signed)
Procedure Name: MAC Date/Time: 07/27/2021 11:11 AM Performed by: Jeannene Patella, CRNA Pre-anesthesia Checklist: Patient identified, Emergency Drugs available, Suction available, Timeout performed and Patient being monitored Patient Re-evaluated:Patient Re-evaluated prior to induction Oxygen Delivery Method: Nasal cannula Placement Confirmation: positive ETCO2

## 2021-07-27 NOTE — Op Note (Signed)
PREOPERATIVE DIAGNOSIS:  Nuclear sclerotic cataract of the right eye.   POSTOPERATIVE DIAGNOSIS:  H25.11 Cataract   OPERATIVE PROCEDURE:ORPROCALL@   SURGEON:  Birder Robson, MD.   ANESTHESIA:  Anesthesiologist: Veda Canning, MD CRNA: Jeannene Patella, CRNA  1.      Managed anesthesia care. 2.      0.40ml of Shugarcaine was instilled in the eye following the paracentesis.   COMPLICATIONS:  None.   TECHNIQUE:   Stop and chop   DESCRIPTION OF PROCEDURE:  The patient was examined and consented in the preoperative holding area where the aforementioned topical anesthesia was applied to the right eye and then brought back to the Operating Room where the right eye was prepped and draped in the usual sterile ophthalmic fashion and a lid speculum was placed. A paracentesis was created with the side port blade and the anterior chamber was filled with viscoelastic. A near clear corneal incision was performed with the steel keratome. A continuous curvilinear capsulorrhexis was performed with a cystotome followed by the capsulorrhexis forceps. Hydrodissection and hydrodelineation were carried out with BSS on a blunt cannula. The lens was removed in a stop and chop  technique and the remaining cortical material was removed with the irrigation-aspiration handpiece. The capsular bag was inflated with viscoelastic and the Technis ZCB00  lens was placed in the capsular bag without complication. The remaining viscoelastic was removed from the eye with the irrigation-aspiration handpiece. The wounds were hydrated. The anterior chamber was flushed with BSS and the eye was inflated to physiologic pressure. 0.55ml of Vigamox was placed in the anterior chamber. The wounds were found to be water tight. The eye was dressed with Combigan. The patient was given protective glasses to wear throughout the day and a shield with which to sleep tonight. The patient was also given drops with which to begin a drop regimen today  and will follow-up with me in one day. Implant Name Type Inv. Item Serial No. Manufacturer Lot No. LRB No. Used Action  LENS IOL TECNIS EYHANCE 23.0 - [id number] Intraocular Lens LENS IOL TECNIS EYHANCE 23.0 [id number] [employer]  Right 1 Implanted   Procedure(s) with comments: CATARACT EXTRACTION PHACO AND INTRAOCULAR LENS PLACEMENT (IOC) RIGHT 7.43 00:35.1 (Right) - needs later arrival  Electronically signed: Birder Robson 07/27/2021 11:26 AM

## 2021-07-27 NOTE — Transfer of Care (Signed)
Immediate Anesthesia Transfer of Care Note  Patient: Kristin Villa  Procedure(s) Performed: CATARACT EXTRACTION PHACO AND INTRAOCULAR LENS PLACEMENT (IOC) RIGHT 7.43 00:35.1 (Right: Eye)  Patient Location: PACU  Anesthesia Type: MAC  Level of Consciousness: awake, alert  and patient cooperative  Airway and Oxygen Therapy: Patient Spontanous Breathing and Patient connected to supplemental oxygen  Post-op Assessment: Post-op Vital signs reviewed, Patient's Cardiovascular Status Stable, Respiratory Function Stable, Patent Airway and No signs of Nausea or vomiting  Post-op Vital Signs: Reviewed and stable  Complications: No notable events documented.

## 2021-07-27 NOTE — H&P (Signed)
[facility]   Primary Care Physician:  Wanita Chamberlain, PA-C Ophthalmologist: Dr. George Ina  Pre-Procedure History & Physical: HPI:  Kristin Villa is a 61 y.o. female here for cataract surgery.   Past Medical History:  Diagnosis Date   Complication of anesthesia    difficult to wake up after anesthesia   Hyperlipidemia    PONV (postoperative nausea and vomiting)    mild   SCC (squamous cell carcinoma)    face   Tachycardia     Past Surgical History:  Procedure Laterality Date   BREAST BIOPSY Right    x3   MASTECTOMY MODIFIED RADICAL Right 07/16/2021   Procedure: MASTECTOMY MODIFIED RADICAL;  Surgeon: Aviva Signs, MD;  Location: [facility];  Service: General;  Laterality: Right;    Prior to Admission medications   Medication Sig Start Date End Date Taking? Authorizing Provider  cyclobenzaprine (FLEXERIL) 10 MG tablet Take 10 mg by mouth 3 (three) times daily as needed for muscle spasms.   Yes [provider]  metoprolol succinate (TOPROL-XL) 25 MG 24 hr tablet Take 25 mg by mouth daily. 06/21/21  Yes [provider]  rosuvastatin (CRESTOR) 5 MG tablet Take 5 mg by mouth daily.   Yes [provider]    Allergies as of 07/01/2021   (No Known Allergies)    Family History  Problem Relation Age of Onset   Dementia Father    Cancer Maternal Grandmother        colon   Cancer Maternal Grandfather        lung    Social History   Socioeconomic History   Marital status: Married    Spouse name: Not on file   Number of children: Not on file   Years of education: Not on file   Highest education level: Not on file  Occupational History   Not on file  Tobacco Use   Smoking status: Every Day    Packs/day: 0.50    Years: 45.00    Pack years: 22.50    Types: Cigarettes   Smokeless tobacco: Never   Tobacco comments:    Started around age 19  Vaping Use   Vaping Use: Never used  Substance and Sexual Activity   Alcohol use: Never   Drug  use: Never   Sexual activity: Yes  Other Topics Concern   Not on file  Social History Narrative   Not on file   Social Determinants of Health   Financial Resource Strain: Not on file  Food Insecurity: Not on file  Transportation Needs: Not on file  Physical Activity: Not on file  Stress: Not on file  Social Connections: Not on file  Intimate Partner Violence: Not on file    Review of Systems: See HPI, otherwise negative ROS  Physical Exam: BP (!) 121/59    Pulse 87    Temp 98 F (36.7 C) (Temporal)    Resp 18    Ht 5\' 7"  (1.702 m)    Wt 67.8 kg    SpO2 98%    BMI 23.42 kg/m  General:   Alert, cooperative in NAD Head:  Normocephalic and atraumatic. Respiratory:  Normal work of breathing. Cardiovascular:  RRR  Impression/Plan: Kristin Villa is here for cataract surgery.  Risks, benefits, limitations, and alternatives regarding cataract surgery have been reviewed with the patient.  Questions have been answered.  All parties agreeable.   Birder Robson, MD  07/27/2021, 11:00 AM

## 2021-07-29 ENCOUNTER — Encounter: Payer: Self-pay | Admitting: General Surgery

## 2021-07-29 ENCOUNTER — Ambulatory Visit (INDEPENDENT_AMBULATORY_CARE_PROVIDER_SITE_OTHER): Payer: Managed Care, Other (non HMO) | Admitting: General Surgery

## 2021-07-29 ENCOUNTER — Telehealth: Payer: Self-pay | Admitting: *Deleted

## 2021-07-29 ENCOUNTER — Other Ambulatory Visit: Payer: Self-pay

## 2021-07-29 VITALS — BP 115/76 | HR 93 | Temp 98.6°F | Resp 14 | Ht 67.0 in | Wt 148.0 lb

## 2021-07-29 DIAGNOSIS — Z09 Encounter for follow-up examination after completed treatment for conditions other than malignant neoplasm: Secondary | ICD-10-CM

## 2021-07-29 NOTE — Progress Notes (Signed)
Subjective:     Kristin Villa  Here for follow-up wound check.  Patient has no complaints.  Her bruising is resolving.  The drainage in her JP drain has decreased. Objective:    BP 115/76    Pulse 93    Temp 98.6 F (37 C) (Other (Comment))    Resp 14    Ht 5\' 7"  (1.702 m)    Wt 148 lb (67.1 kg)    SpO2 97%    BMI 23.18 kg/m   General:  alert, cooperative, and no distress  Right breast incision healing well.  Ecchymosis resolving.  Remaining staples removed.  JP drain removed.     Assessment:    Doing well postoperatively.    Plan:   May increase activity as able.  Follow-up here in 3 weeks for wound check.  Patient is already making arrangements to follow-up with oncology at [facility].

## 2021-07-29 NOTE — Telephone Encounter (Signed)
Received call from patient.    Reports that patient is scheduled to return to work on 08/09/2021 from mastectomy on 07/16/2021. 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   Requested return to work note for 08/12/2021 with no restrictions to be e-mailed to Laural Golden, supervisor at [email or web address].  Letter sent.

## 2021-07-30 ENCOUNTER — Encounter: Payer: Self-pay | Admitting: Ophthalmology

## 2021-08-05 NOTE — Discharge Instructions (Signed)

## 2021-08-10 ENCOUNTER — Encounter: Payer: Self-pay | Admitting: Ophthalmology

## 2021-08-10 ENCOUNTER — Ambulatory Visit
Admission: RE | Admit: 2021-08-10 | Discharge: 2021-08-10 | Disposition: A | Discharge status: Home / Self Care | Payer: Managed Care, Other (non HMO) | Source: Ambulatory Visit | Attending: Ophthalmology | Admitting: Ophthalmology

## 2021-08-10 ENCOUNTER — Other Ambulatory Visit: Payer: Self-pay

## 2021-08-10 ENCOUNTER — Encounter
Admission: RE | Disposition: A | Discharge status: Home / Self Care | Payer: Self-pay | Source: Ambulatory Visit | Attending: Ophthalmology

## 2021-08-10 ENCOUNTER — Ambulatory Visit: Payer: Managed Care, Other (non HMO) | Admitting: Anesthesiology

## 2021-08-10 DIAGNOSIS — H2512 Age-related nuclear cataract, left eye: Secondary | ICD-10-CM | POA: Insufficient documentation

## 2021-08-10 DIAGNOSIS — F17210 Nicotine dependence, cigarettes, uncomplicated: Secondary | ICD-10-CM | POA: Diagnosis not present

## 2021-08-10 HISTORY — PX: CATARACT EXTRACTION W/PHACO: SHX586

## 2021-08-10 SURGERY — PHACOEMULSIFICATION, CATARACT, WITH IOL INSERTION
Anesthesia: Monitor Anesthesia Care | Site: Eye | Laterality: Left

## 2021-08-10 MED ORDER — SIGHTPATH DOSE#1 NA CHONDROIT SULF-NA HYALURON 40-17 MG/ML IO SOLN
INTRAOCULAR | Status: DC | PRN
  Administered 2021-08-10: 1 mL via INTRAOCULAR

## 2021-08-10 MED ORDER — FENTANYL CITRATE (PF) 100 MCG/2ML IJ SOLN
INTRAMUSCULAR | Status: DC | PRN
  Administered 2021-08-10 (×2): 50 ug via INTRAVENOUS

## 2021-08-10 MED ORDER — TETRACAINE HCL 0.5 % OP SOLN
1.0000 [drp] | OPHTHALMIC | Status: DC | PRN
  Administered 2021-08-10 (×3): 1 [drp] via OPHTHALMIC

## 2021-08-10 MED ORDER — DEXMEDETOMIDINE (PRECEDEX) IN NS 20 MCG/5ML (4 MCG/ML) IV SYRINGE
PREFILLED_SYRINGE | INTRAVENOUS | Status: DC | PRN
  Administered 2021-08-10: 10 ug via INTRAVENOUS

## 2021-08-10 MED ORDER — MIDAZOLAM HCL 2 MG/2ML IJ SOLN
INTRAMUSCULAR | Status: DC | PRN
  Administered 2021-08-10 (×2): 1 mg via INTRAVENOUS

## 2021-08-10 MED ORDER — ARMC OPHTHALMIC DILATING DROPS
1.0000 "application " | OPHTHALMIC | Status: DC | PRN
  Administered 2021-08-10 (×3): 1 via OPHTHALMIC

## 2021-08-10 MED ORDER — SIGHTPATH DOSE#1 BSS IO SOLN
INTRAOCULAR | Status: DC | PRN
  Administered 2021-08-10: 36 mL via OPHTHALMIC

## 2021-08-10 MED ORDER — SIGHTPATH DOSE#1 BSS IO SOLN
INTRAOCULAR | Status: DC | PRN
  Administered 2021-08-10: 1 mL via INTRAMUSCULAR

## 2021-08-10 MED ORDER — BRIMONIDINE TARTRATE-TIMOLOL 0.2-0.5 % OP SOLN
OPHTHALMIC | Status: DC | PRN
  Administered 2021-08-10: 1 [drp] via OPHTHALMIC

## 2021-08-10 MED ORDER — MOXIFLOXACIN HCL 0.5 % OP SOLN
OPHTHALMIC | Status: DC | PRN
  Administered 2021-08-10: 0.2 mL via OPHTHALMIC

## 2021-08-10 MED ORDER — LACTATED RINGERS IV SOLN: INTRAVENOUS | Status: DC

## 2021-08-10 MED ORDER — SIGHTPATH DOSE#1 BSS IO SOLN
INTRAOCULAR | Status: DC | PRN
  Administered 2021-08-10: 15 mL

## 2021-08-10 SURGICAL SUPPLY — 12 items
CATARACT SUITE SIGHTPATH (MISCELLANEOUS) ×2 IMPLANT
FEE CATARACT SUITE SIGHTPATH (MISCELLANEOUS) ×2 IMPLANT
GLOVE SURG ENC TEXT LTX SZ8 (GLOVE) ×3 IMPLANT
GLOVE SURG TRIUMPH 8.0 PF LTX (GLOVE) ×3 IMPLANT
LENS IOL TECNIS EYHANCE 23.0 (Intraocular Lens) ×1 IMPLANT
NDL FILTER BLUNT 18X1 1/2 (NEEDLE) ×2 IMPLANT
NEEDLE FILTER BLUNT 18X 1/2SAF (NEEDLE) ×1
NEEDLE FILTER BLUNT 18X1 1/2 (NEEDLE) ×1 IMPLANT
SUT ETHILON 10-0 CS-B-6CS-B-6 (SUTURE) ×2
SUTURE EHLN 10-0 CS-B-6CS-B-6 (SUTURE) IMPLANT
SYR 3ML LL SCALE MARK (SYRINGE) ×3 IMPLANT
WATER STERILE IRR 250ML POUR (IV SOLUTION) ×3 IMPLANT

## 2021-08-10 NOTE — Anesthesia Procedure Notes (Signed)
Procedure Name: MAC Date/Time: 08/10/2021 11:46 AM Performed by: Cameron Ali, CRNA Pre-anesthesia Checklist: Patient identified, Emergency Drugs available, Suction available, Timeout performed and Patient being monitored Patient Re-evaluated:Patient Re-evaluated prior to induction Oxygen Delivery Method: Nasal cannula Placement Confirmation: positive ETCO2

## 2021-08-10 NOTE — Transfer of Care (Signed)
Immediate Anesthesia Transfer of Care Note  Patient: Kristin Villa  Procedure(s) Performed: CATARACT EXTRACTION PHACO AND INTRAOCULAR LENS PLACEMENT (IOC) LEFT 7.59 00:42.1 (Left: Eye)  Patient Location: PACU  Anesthesia Type: MAC  Level of Consciousness: awake, alert  and patient cooperative  Airway and Oxygen Therapy: Patient Spontanous Breathing and Patient connected to supplemental oxygen  Post-op Assessment: Post-op Vital signs reviewed, Patient's Cardiovascular Status Stable, Respiratory Function Stable, Patent Airway and No signs of Nausea or vomiting  Post-op Vital Signs: Reviewed and stable  Complications: No notable events documented.

## 2021-08-10 NOTE — Anesthesia Postprocedure Evaluation (Signed)
Anesthesia Post Note  Patient: Kristin Villa  Procedure(s) Performed: CATARACT EXTRACTION PHACO AND INTRAOCULAR LENS PLACEMENT (IOC) LEFT 7.59 00:42.1 (Left: Eye)     Patient location during evaluation: PACU Anesthesia Type: MAC Level of consciousness: awake and alert Pain management: pain level controlled Vital Signs Assessment: post-procedure vital signs reviewed and stable Respiratory status: spontaneous breathing and nonlabored ventilation Cardiovascular status: blood pressure returned to baseline Postop Assessment: no apparent nausea or vomiting Anesthetic complications: no   No notable events documented.  Airam Run[employer]

## 2021-08-10 NOTE — Op Note (Signed)
PREOPERATIVE DIAGNOSIS:  Nuclear sclerotic cataract of the left eye.   POSTOPERATIVE DIAGNOSIS:  Nuclear sclerotic cataract of the left eye.   OPERATIVE PROCEDURE:ORPROCALL@   SURGEON:  Kristin Robson, MD.   ANESTHESIA:  Anesthesiologist: Ardeth Sportsman, MD CRNA: Cameron Ali, CRNA  1.      Managed anesthesia care. 2.     0.11ml of Shugarcaine was instilled following the paracentesis   COMPLICATIONS: a 89-3 nylon suture was placed in the main incision for added security   TECHNIQUE:   Stop and chop   DESCRIPTION OF PROCEDURE:  The patient was examined and consented in the preoperative holding area where the aforementioned topical anesthesia was applied to the left eye and then brought back to the Operating Room where the left eye was prepped and draped in the usual sterile ophthalmic fashion and a lid speculum was placed. A paracentesis was created with the side port blade and the anterior chamber was filled with viscoelastic. A near clear corneal incision was performed with the steel keratome. A continuous curvilinear capsulorrhexis was performed with a cystotome followed by the capsulorrhexis forceps. Hydrodissection and hydrodelineation were carried out with BSS on a blunt cannula. The lens was removed in a stop and chop  technique and the remaining cortical material was removed with the irrigation-aspiration handpiece. The capsular bag was inflated with viscoelastic and the Technis ZCB00 lens was placed in the capsular bag without complication. The remaining viscoelastic was removed from the eye with the irrigation-aspiration handpiece. The wounds were hydrated. The anterior chamber was flushed with BSS and the eye was inflated to physiologic pressure. 0.34ml Vigamox was placed in the anterior chamber. The wounds were found to be water tight. The eye was dressed with Combigan. The patient was given protective glasses to wear throughout the day and a shield with which to sleep tonight. The  patient was also given drops with which to begin a drop regimen today and will follow-up with me in one day. Implant Name Type Inv. Item Serial No. Manufacturer Lot No. LRB No. Used Action  LENS IOL TECNIS EYHANCE 23.0 - [id number] Intraocular Lens LENS IOL TECNIS EYHANCE 23.0 [id number] [employer]  Left 1 Implanted    Procedure(s): CATARACT EXTRACTION PHACO AND INTRAOCULAR LENS PLACEMENT (IOC) LEFT 7.59 00:42.1 (Left)  Electronically signed: Birder Villa 08/10/2021 12:05 PM

## 2021-08-10 NOTE — H&P (Signed)
[facility]   Primary Care Physician:  Wanita Chamberlain, PA-C Ophthalmologist: Dr.Daylee Delahoz  Pre-Procedure History & Physical: HPI:  Kristin Villa is a 61 y.o. female here for cataract surgery.   Past Medical History:  Diagnosis Date   Complication of anesthesia    difficult to wake up after anesthesia   Hyperlipidemia    PONV (postoperative nausea and vomiting)    mild   SCC (squamous cell carcinoma)    face   Tachycardia     Past Surgical History:  Procedure Laterality Date   BREAST BIOPSY Right    x3   CATARACT EXTRACTION W/PHACO Right 07/27/2021   Procedure: CATARACT EXTRACTION PHACO AND INTRAOCULAR LENS PLACEMENT (IOC) RIGHT 7.43 00:35.1;  Surgeon: Birder Robson, MD;  Location: [place];  Service: Ophthalmology;  Laterality: Right;  needs later arrival   [place] Right 07/16/2021   Procedure: MASTECTOMY MODIFIED RADICAL;  Surgeon: Aviva Signs, MD;  Location: [facility];  Service: General;  Laterality: Right;    Prior to Admission medications   Medication Sig Start Date End Date Taking? Authorizing Provider  acetaminophen (TYLENOL) 500 MG tablet Take 500 mg by mouth every 6 (six) hours as needed.   Yes [provider]  cyclobenzaprine (FLEXERIL) 10 MG tablet Take 10 mg by mouth 3 (three) times daily as needed for muscle spasms.   Yes [provider]  metoprolol succinate (TOPROL-XL) 25 MG 24 hr tablet Take 25 mg by mouth daily. 06/21/21  Yes [provider]  rosuvastatin (CRESTOR) 5 MG tablet Take 5 mg by mouth daily.    [provider]    Allergies as of 07/01/2021   (No Known Allergies)    Family History  Problem Relation Age of Onset   Dementia Father    Cancer Maternal Grandmother        colon   Cancer Maternal Grandfather        lung    Social History   Socioeconomic History   Marital status: Widowed    Spouse name: Not on file   Number of children: Not on file   Years of  education: Not on file   Highest education level: Not on file  Occupational History   Not on file  Tobacco Use   Smoking status: Every Day    Packs/day: 0.50    Years: 45.00    Pack years: 22.50    Types: Cigarettes   Smokeless tobacco: Never   Tobacco comments:    Started around age 58  Vaping Use   Vaping Use: Never used  Substance and Sexual Activity   Alcohol use: Never   Drug use: Never   Sexual activity: Yes  Other Topics Concern   Not on file  Social History Narrative   Not on file   Social Determinants of Health   Financial Resource Strain: Not on file  Food Insecurity: Not on file  Transportation Needs: Not on file  Physical Activity: Not on file  Stress: Not on file  Social Connections: Not on file  Intimate Partner Violence: Not on file    Review of Systems: See HPI, otherwise negative ROS  Physical Exam: BP 112/63    Pulse 85    Temp 97.9 F (36.6 C)    Ht 5\' 7"  (1.702 m)    Wt 66.2 kg    SpO2 97%    BMI 22.87 kg/m  General:   Alert, cooperative in NAD Head:  Normocephalic and atraumatic. Respiratory:  Normal work of breathing. Cardiovascular:  RRR  Impression/Plan: Kristin Villa is here for cataract surgery.  Risks, benefits, limitations, and alternatives regarding cataract surgery have been reviewed with the patient.  Questions have been answered.  All parties agreeable.   Birder Robson, MD  08/10/2021, 11:36 AM

## 2021-08-10 NOTE — Anesthesia Preprocedure Evaluation (Signed)
Anesthesia Evaluation  Patient identified by MRN, date of birth, ID band Patient awake    Reviewed: Allergy & Precautions, NPO status , Patient's Chart, lab work & pertinent test results, reviewed documented beta blocker date and time   History of Anesthesia Complications (+) PONV and history of anesthetic complications  Airway Mallampati: II  TM Distance: >3 FB     Dental no notable dental hx.    Pulmonary Current Smoker and Patient abstained from smoking.,    Pulmonary exam normal        Cardiovascular Normal cardiovascular exam  HLD   Neuro/Psych negative neurological ROS  negative psych ROS   GI/Hepatic negative GI ROS, Neg liver ROS,   Endo/Other  negative endocrine ROS  Renal/GU negative Renal ROS     Musculoskeletal negative musculoskeletal ROS (+)   Abdominal Normal abdominal exam  (+)   Peds  Hematology negative hematology ROS (+)   Anesthesia Other Findings Breast cancer - s/p modified radical mastectomy 07/16/21  Reproductive/Obstetrics                             Anesthesia Physical  Anesthesia Plan  ASA: 2  Anesthesia Plan: MAC   Post-op Pain Management: Minimal or no pain anticipated   Induction: Intravenous  PONV Risk Score and Plan: 3 and TIVA, Midazolam and Treatment may vary due to age or medical condition  Airway Management Planned: Natural Airway and Nasal Cannula  Additional Equipment: None  Intra-op Plan:   Post-operative Plan:   Informed Consent: I have reviewed the patients History and Physical, chart, labs and discussed the procedure including the risks, benefits and alternatives for the proposed anesthesia with the patient or authorized representative who has indicated his/her understanding and acceptance.     Dental advisory given  Plan Discussed with: CRNA  Anesthesia Plan Comments:         Anesthesia Quick Evaluation

## 2021-08-11 ENCOUNTER — Encounter: Payer: Self-pay | Admitting: Ophthalmology

## 2021-08-13 ENCOUNTER — Telehealth: Payer: Self-pay | Admitting: *Deleted

## 2021-08-13 NOTE — Telephone Encounter (Signed)
Received call from Ebony Hail, nurse navigator with [facility] [phone] telephone.   Reports that patient has [place] and possible biopsy scheduled for 08/18/2021 with [facility] in [place], [place]. Reports that patient has post op appointment with Dr. Arnoldo Morale scheduled for 08/17/2021. Inquired if patient would need to re-schedule appointment to date after US/ Biopsy.   Call placed to patient. States that she would prefer to keep appointment with Dr. Arnoldo Morale prior to imaging.   Dr. Arnoldo Morale made aware.

## 2021-08-17 ENCOUNTER — Ambulatory Visit (INDEPENDENT_AMBULATORY_CARE_PROVIDER_SITE_OTHER): Payer: Managed Care, Other (non HMO) | Admitting: General Surgery

## 2021-08-17 ENCOUNTER — Encounter: Payer: Self-pay | Admitting: General Surgery

## 2021-08-17 ENCOUNTER — Other Ambulatory Visit: Payer: Self-pay

## 2021-08-17 VITALS — BP 129/79 | HR 73 | Temp 98.3°F | Resp 12 | Ht 67.0 in | Wt 150.0 lb

## 2021-08-17 DIAGNOSIS — Z09 Encounter for follow-up examination after completed treatment for conditions other than malignant neoplasm: Secondary | ICD-10-CM

## 2021-08-17 NOTE — Progress Notes (Signed)
Subjective:     Kristin Villa  Patient here for follow-up.  She is getting a follow-up ultrasound of the right axilla tomorrow as the original ultrasound and biopsy showed a positive lymph node but final pathology after her mastectomy revealed a negative lymph node. Objective:    BP 129/79    Pulse 73    Temp 98.3 F (36.8 C) (Other (Comment))    Resp 12    Ht 5\' 7"  (1.702 m)    Wt 150 lb (68 kg)    SpO2 95%    BMI 23.49 kg/m   General:  alert, cooperative, and no distress  Right mastectomy scar healing well.  Right axilla without any palpable mass.     Assessment:    Doing well postoperatively. Discordant pathology reports    Plan:   We will follow-up with ultrasound of right axilla.  Further management is pending those results.

## 2021-08-20 ENCOUNTER — Other Ambulatory Visit: Payer: Self-pay | Admitting: *Deleted

## 2021-08-20 DIAGNOSIS — C50411 Malignant neoplasm of upper-outer quadrant of right female breast: Secondary | ICD-10-CM

## 2021-08-20 DIAGNOSIS — Z170 Estrogen receptor positive status [ER+]: Secondary | ICD-10-CM

## 2021-08-20 DIAGNOSIS — Z01818 Encounter for other preprocedural examination: Secondary | ICD-10-CM

## 2021-08-24 ENCOUNTER — Other Ambulatory Visit: Payer: Self-pay | Admitting: General Surgery

## 2021-08-24 NOTE — H&P (Signed)
Kristin Villa; [id number]; 1961-04-17   HPI Patient is a 61 year old white female who was referred to my care by Kristin Villa for evaluation treatment of a newly diagnosed right breast cancer.  Patient noticed a lump in her right breast and core biopsies of both the right breast mass and a right axillary lymph node were positive for invasive mammary carcinoma, ER, PR positive.  Patient has had multiple right breast biopsies in the past.  She denies any family history of breast cancer.  She denies any nipple discharge.  Patient underwent right modified radical mastectomy on 07/16/2021.  Final pathology did not reveal any lymph nodes present.  Repeat ultrasound of the axilla reveals several lymph nodes that are high in the axilla next to the axillary vessels.  Thus, the patient is undergoing a right axillary dissection to remove the positive lymph node. Past Medical History:  Diagnosis Date   Hyperlipidemia    Hypertension    SCC (squamous cell carcinoma)    face    Past Surgical History:  Procedure Laterality Date   BREAST BIOPSY Right    x3    Family History  Problem Relation Age of Onset   Dementia Father    Cancer Maternal Grandmother        colon   Cancer Maternal Grandfather        lung    Current Outpatient Medications on File Prior to Visit  Medication Sig Dispense Refill   metoprolol succinate (TOPROL-XL) 25 MG 24 hr tablet Take 25 mg by mouth daily.     rosuvastatin (CRESTOR) 5 MG tablet Take by mouth.     No current facility-administered medications on file prior to visit.    No Known Allergies  Social History   Substance and Sexual Activity  Alcohol Use Never    Social History   Tobacco Use  Smoking Status Every Day   Types: Cigarettes  Smokeless Tobacco Never    Review of Systems  Constitutional: Negative.   HENT: Negative.    Eyes: Negative.   Respiratory: Negative.    Cardiovascular: Negative.   Gastrointestinal: Negative.   Genitourinary:  Negative.   Musculoskeletal: Negative.   Skin: Negative.   Neurological: Negative.   Endo/Heme/Allergies: Negative.   Psychiatric/Behavioral: Negative.     Objective   Vitals:   07/01/21 1133  BP: (!) 139/59  Pulse: 69  Resp: 18  Temp: 98.9 F (37.2 C)  SpO2: 96%    Physical Exam Vitals reviewed. Exam conducted with a chaperone present.  Constitutional:      Appearance: Normal appearance. She is normal weight. She is not ill-appearing.  HENT:     Head: Normocephalic and atraumatic.  Cardiovascular:     Rate and Rhythm: Normal rate and regular rhythm.     Heart sounds: Normal heart sounds. No murmur heard.   No friction rub. No gallop.  Pulmonary:     Effort: Pulmonary effort is normal. No respiratory distress.     Breath sounds: Normal breath sounds. No stridor. No wheezing, rhonchi or rales.  Lymphadenopathy:     Cervical: No cervical adenopathy.  Skin:    General: Skin is warm and dry.  Neurological:     Mental Status: She is alert and oriented to person, place, and time.  Breast: Dominant 2 cm mass in the upper outer quadrant of the right breast with another easily palpable mass in the lower portion of the right axilla.  There is bilateral nipple retraction.  A large tattoo is  noted in the upper portion of the right breast.  The left breast is without a dominant mass, nipple discharge, or axillary lymphadenopathy. 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  The risks and benefits of the procedure including bleeding, infection, cardiopulmonary difficulties, and right arm swelling were fully explained to the patient, who gave informed consent.

## 2021-08-25 LAB — COMPREHENSIVE METABOLIC PANEL
ALT: 12 IU/L (ref 0–32)
AST: 16 IU/L (ref 0–40)
Albumin/Globulin Ratio: 2.1 (ref 1.2–2.2)
Albumin: 4.4 g/dL (ref 3.8–4.8)
Alkaline Phosphatase: 83 IU/L (ref 44–121)
BUN/Creatinine Ratio: 14 (ref 12–28)
BUN: 13 mg/dL (ref 8–27)
Bilirubin Total: 0.4 mg/dL (ref 0.0–1.2)
CO2: 26 mmol/L (ref 20–29)
Calcium: 9.7 mg/dL (ref 8.7–10.3)
Chloride: 108 mmol/L — ABNORMAL HIGH (ref 96–106)
Creatinine, Ser: 0.91 mg/dL (ref 0.57–1.00)
Globulin, Total: 2.1 g/dL (ref 1.5–4.5)
Glucose: 126 mg/dL — ABNORMAL HIGH (ref 70–99)
Potassium: 4.7 mmol/L (ref 3.5–5.2)
Sodium: 145 mmol/L — ABNORMAL HIGH (ref 134–144)
Total Protein: 6.5 g/dL (ref 6.0–8.5)
eGFR: 71 mL/min/{1.73_m2} (ref >59)

## 2021-08-25 LAB — CBC WITH DIFFERENTIAL
Basophils Absolute: 0 10*3/uL (ref 0.0–0.2)
Basos: 0 %
EOS (ABSOLUTE): 0.1 10*3/uL (ref 0.0–0.4)
Eos: 1 %
Hematocrit: 40.9 % (ref 34.0–46.6)
Hemoglobin: 13.8 g/dL (ref 11.1–15.9)
Immature Grans (Abs): 0.1 10*3/uL (ref 0.0–0.1)
Immature Granulocytes: 1 %
Lymphocytes Absolute: 2.8 10*3/uL (ref 0.7–3.1)
Lymphs: 29 %
MCH: 32.9 pg (ref 26.6–33.0)
MCHC: 33.7 g/dL (ref 31.5–35.7)
MCV: 98 fL — ABNORMAL HIGH (ref 79–97)
Monocytes Absolute: 0.6 10*3/uL (ref 0.1–0.9)
Monocytes: 6 %
Neutrophils Absolute: 5.9 10*3/uL (ref 1.4–7.0)
Neutrophils: 63 %
RBC: 4.19 x10E6/uL (ref 3.77–5.28)
RDW: 12.4 % (ref 11.7–15.4)
WBC: 9.5 10*3/uL (ref 3.4–10.8)

## 2021-08-26 NOTE — Patient Instructions (Signed)
Kristin Villa  08/26/2021     @PREFPERIOPPHARMACY @   Your procedure is scheduled on  09/02/2021.   Report to [facility] at  0700  A.M.   Call this number if you have problems the morning of surgery:  [phone]   Remember:  Do not eat or drink after midnight.      Take these medicines the morning of surgery with A SIP OF WATER                                flexeril(if needed), metoprolol.     Do not wear jewelry, make-up or nail polish.  Do not wear lotions, powders, or perfumes, or deodorant.  Do not shave 48 hours prior to surgery.  Men may shave face and neck.  Do not bring valuables to the hospital.  [facility] is not responsible for any belongings or valuables.  Contacts, dentures or bridgework may not be worn into surgery.  Leave your suitcase in the car.  After surgery it may be brought to your room.  For patients admitted to the hospital, discharge time will be determined by your treatment team.  Patients discharged the day of surgery will not be allowed to drive home and must have someone with them for 24 hours.    Special instructions:   DO NOT smoke tobacco or vape for 24 hours before your procedure.  Please read over the following fact sheets that you were given. Coughing and Deep Breathing, Surgical Site Infection Prevention, Anesthesia Post-op Instructions, and Care and Recovery After Surgery      Axillary Lymph Node Dissection, Care After The following information offers guidance on how to care for yourself after your procedure. Your health care provider may also give you more specific instructions. If you have problems or questions, contact your health care provider. What can I expect after the procedure? After the procedure, it is common to have: Pain and soreness around your incision area. Trouble moving your arm or shoulder. A small amount of swelling in your arm. Numbness on the upper and inside parts of your arm. Follow these  instructions at home: Medicines Take over-the-counter and prescription medicines only as told by your health care provider. If you were prescribed an antibiotic medicine, take it as told by your health care provider. Do not stop taking the antibiotic even if you start to feel better. Incision care  Follow instructions from your health care provider about how to take care of your incision. Make sure you: Wash your hands with soap and water for at least 20 seconds before and after you change your bandage (dressing). If soap and water are not available, use hand sanitizer. Change your dressing as told by your health care provider. Leave stitches (sutures), skin glue, or adhesive strips in place. These skin closures may need to stay in place for 2 weeks or longer. If adhesive strip edges start to loosen and curl up, you may trim the loose edges. Do not remove adhesive strips completely unless your health care provider tells you to do that. Check your incision area every day for signs of infection. Check for: Warmth. Redness, more swelling or more pain. Fluid or blood. Pus or a bad smell. Activity If you were given a sedative during the procedure, it can affect you for several hours. Do not drive or operate machinery until your health care  provider says that it is safe. Do not drive or use heavy machinery while taking prescription pain medicine. Do not take baths, swim, or use a hot tub until your health care provider approves. Ask your health care provider if you may take showers. You may only be allowed to take sponge baths. Do arm and shoulder exercises as told by your health care provider. This may prevent movement problems and stiffness. Return to your normal activities as told by your health care provider. Ask your health care provider what activities are safe for you. Avoid any activities that cause pain. General instructions If a drainage tube was left in your breast, care for it as told by  your health care provider. The drain may stay in place for up to 7-10 days. Wear a compression garment on your arm as told by your health care provider. This may help to prevent blood clots and reduce swelling in your arm. Do not use any products that contain nicotine or tobacco. These products include cigarettes, chewing tobacco, and vaping devices, such as e-cigarettes. If you need help quitting, ask your health care provider. Do not have your blood pressure taken, have blood drawn, or get injections or IVs in the arm on the side where your lymph nodes were removed. Follow instructions from your health care provider about how often you should be screened for extra fluid around your lymph nodes (lymphedema). Keep all follow-up visits. This is important. Contact a health care provider if: Your arm is swollen, tight, and painful. You have redness, more swelling, or more pain around your incision. You have fluid or blood coming from your incision. Your incision feels warm to the touch. You have pus or a bad smell coming from your incision. Get help right away if: You have severe pain that does not get better with medicine. You have a fever or chills. You feel confused or disoriented. You have chest pain. You have shortness of breath. These symptoms may represent a serious problem that is an emergency. Do not wait to see if the symptoms will go away. Get medical help right away. Call your local emergency services (911 in the [place] Do not drive yourself to the hospital. Summary After the procedure, it is common to have pain and soreness and trouble moving your arm or shoulder. A small amount of arm swelling is normal after the procedure. However, you should contact your health care provider if your arm is swollen, tight, and painful. Wear a compression garment on your arm as told by your health care provider. This may help to prevent blood clots and reduce swelling in your arm. Do arm and  shoulder exercises as directed to help prevent movement problems and stiffness. If a drainage tube was left in your breast, care for it as told by your health care provider. This information is not intended to replace advice given to you by your health care provider. Make sure you discuss any questions you have with your health care provider. Document Revised: 03/26/2020 Document Reviewed: 03/26/2020 Elsevier Patient Education  2022 [employer]. How to Use Chlorhexidine for Bathing Chlorhexidine gluconate (CHG) is a germ-killing (antiseptic) solution that is used to clean the skin. It can get rid of the bacteria that normally live on the skin and can keep them away for about 24 hours. To clean your skin with CHG, you may be given: A CHG solution to use in the shower or as part of a sponge bath. A prepackaged cloth that  contains CHG. Cleaning your skin with CHG may help lower the risk for infection: While you are staying in the intensive care unit of the hospital. If you have a vascular access, such as a central line, to provide short-term or long-term access to your veins. If you have a catheter to drain urine from your bladder. If you are on a ventilator. A ventilator is a machine that helps you breathe by moving air in and out of your lungs. After surgery. What are the risks? Risks of using CHG include: A skin reaction. Hearing loss, if CHG gets in your ears and you have a perforated eardrum. Eye injury, if CHG gets in your eyes and is not rinsed out. The CHG product catching fire. Make sure that you avoid smoking and flames after applying CHG to your skin. Do not use CHG: If you have a chlorhexidine allergy or have previously reacted to chlorhexidine. On babies younger than 37 months of age. How to use CHG solution Use CHG only as told by your health care provider, and follow the instructions on the label. Use the full amount of CHG as directed. Usually, this is one bottle. During a  shower Follow these steps when using CHG solution during a shower (unless your health care provider gives you different instructions): Start the shower. Use your normal soap and shampoo to wash your face and hair. Turn off the shower or move out of the shower stream. Pour the CHG onto a clean washcloth. Do not use any type of brush or rough-edged sponge. Starting at your neck, lather your body down to your toes. Make sure you follow these instructions: If you will be having surgery, pay special attention to the part of your body where you will be having surgery. Scrub this area for at least 1 minute. Do not use CHG on your head or face. If the solution gets into your ears or eyes, rinse them well with water. Avoid your genital area. Avoid any areas of skin that have broken skin, cuts, or scrapes. Scrub your back and under your arms. Make sure to wash skin folds. Let the lather sit on your skin for 1-2 minutes or as long as told by your health care provider. Thoroughly rinse your entire body in the shower. Make sure that all body creases and crevices are rinsed well. Dry off with a clean towel. Do not put any substances on your body afterward--such as powder, lotion, or perfume--unless you are told to do so by your health care provider. Only use lotions that are recommended by the manufacturer. Put on clean clothes or pajamas. If it is the night before your surgery, sleep in clean sheets.  During a sponge bath Follow these steps when using CHG solution during a sponge bath (unless your health care provider gives you different instructions): Use your normal soap and shampoo to wash your face and hair. Pour the CHG onto a clean washcloth. Starting at your neck, lather your body down to your toes. Make sure you follow these instructions: If you will be having surgery, pay special attention to the part of your body where you will be having surgery. Scrub this area for at least 1 minute. Do not use  CHG on your head or face. If the solution gets into your ears or eyes, rinse them well with water. Avoid your genital area. Avoid any areas of skin that have broken skin, cuts, or scrapes. Scrub your back and under your arms. Make sure  to wash skin folds. Let the lather sit on your skin for 1-2 minutes or as long as told by your health care provider. Using a different clean, wet washcloth, thoroughly rinse your entire body. Make sure that all body creases and crevices are rinsed well. Dry off with a clean towel. Do not put any substances on your body afterward--such as powder, lotion, or perfume--unless you are told to do so by your health care provider. Only use lotions that are recommended by the manufacturer. Put on clean clothes or pajamas. If it is the night before your surgery, sleep in clean sheets. How to use CHG prepackaged cloths Only use CHG cloths as told by your health care provider, and follow the instructions on the label. Use the CHG cloth on clean, dry skin. Do not use the CHG cloth on your head or face unless your health care provider tells you to. When washing with the CHG cloth: Avoid your genital area. Avoid any areas of skin that have broken skin, cuts, or scrapes. Before surgery Follow these steps when using a CHG cloth to clean before surgery (unless your health care provider gives you different instructions): Using the CHG cloth, vigorously scrub the part of your body where you will be having surgery. Scrub using a back-and-forth motion for 3 minutes. The area on your body should be completely wet with CHG when you are done scrubbing. Do not rinse. Discard the cloth and let the area air-dry. Do not put any substances on the area afterward, such as powder, lotion, or perfume. Put on clean clothes or pajamas. If it is the night before your surgery, sleep in clean sheets.  For general bathing Follow these steps when using CHG cloths for general bathing (unless your  health care provider gives you different instructions). Use a separate CHG cloth for each area of your body. Make sure you wash between any folds of skin and between your fingers and toes. Wash your body in the following order, switching to a new cloth after each step: The front of your neck, shoulders, and chest. Both of your arms, under your arms, and your hands. Your stomach and groin area, avoiding the genitals. Your right leg and foot. Your left leg and foot. The back of your neck, your back, and your buttocks. Do not rinse. Discard the cloth and let the area air-dry. Do not put any substances on your body afterward--such as powder, lotion, or perfume--unless you are told to do so by your health care provider. Only use lotions that are recommended by the manufacturer. Put on clean clothes or pajamas. Contact a health care provider if: Your skin gets irritated after scrubbing. You have questions about using your solution or cloth. You swallow any chlorhexidine. Call your local poison control center (1-(737)593-3332 in the [place] Get help right away if: Your eyes itch badly, or they become very red or swollen. Your skin itches badly and is red or swollen. Your hearing changes. You have trouble seeing. You have swelling or tingling in your mouth or throat. You have trouble breathing. These symptoms may represent a serious problem that is an emergency. Do not wait to see if the symptoms will go away. Get medical help right away. Call your local emergency services (911 in the [place] Do not drive yourself to the hospital. Summary Chlorhexidine gluconate (CHG) is a germ-killing (antiseptic) solution that is used to clean the skin. Cleaning your skin with CHG may help to lower your risk for infection.  You may be given CHG to use for bathing. It may be in a bottle or in a prepackaged cloth to use on your skin. Carefully follow your health care provider's instructions and the instructions on the  product label. Do not use CHG if you have a chlorhexidine allergy. Contact your health care provider if your skin gets irritated after scrubbing. This information is not intended to replace advice given to you by your health care provider. Make sure you discuss any questions you have with your health care provider. Document Revised: 08/24/2020 Document Reviewed: 08/24/2020 Elsevier Patient Education  2022 [employer].

## 2021-08-27 ENCOUNTER — Encounter (HOSPITAL_COMMUNITY): Payer: Self-pay

## 2021-08-31 ENCOUNTER — Encounter (HOSPITAL_COMMUNITY)
Admission: RE | Admit: 2021-08-31 | Discharge: 2021-08-31 | Disposition: A | Discharge status: Home / Self Care | Payer: Managed Care, Other (non HMO) | Source: Ambulatory Visit | Attending: General Surgery | Admitting: General Surgery

## 2021-08-31 ENCOUNTER — Encounter (HOSPITAL_COMMUNITY): Payer: Self-pay

## 2021-08-31 DIAGNOSIS — Z01810 Encounter for preprocedural cardiovascular examination: Secondary | ICD-10-CM | POA: Diagnosis not present

## 2021-08-31 HISTORY — DX: Anemia, unspecified: D64.9

## 2021-09-02 ENCOUNTER — Ambulatory Visit (HOSPITAL_COMMUNITY): Payer: Managed Care, Other (non HMO)

## 2021-09-02 ENCOUNTER — Other Ambulatory Visit: Payer: Self-pay

## 2021-09-02 ENCOUNTER — Encounter (HOSPITAL_COMMUNITY)
Admission: RE | Disposition: A | Discharge status: Home / Self Care | Payer: Self-pay | Source: Ambulatory Visit | Attending: General Surgery

## 2021-09-02 ENCOUNTER — Encounter (HOSPITAL_COMMUNITY): Payer: Self-pay | Admitting: General Surgery

## 2021-09-02 ENCOUNTER — Ambulatory Visit (HOSPITAL_COMMUNITY)
Admission: RE | Admit: 2021-09-02 | Discharge: 2021-09-02 | Disposition: A | Discharge status: Home / Self Care | Payer: Managed Care, Other (non HMO) | Source: Ambulatory Visit | Attending: General Surgery | Admitting: General Surgery

## 2021-09-02 ENCOUNTER — Ambulatory Visit (HOSPITAL_BASED_OUTPATIENT_CLINIC_OR_DEPARTMENT_OTHER): Payer: Managed Care, Other (non HMO) | Admitting: Certified Registered"

## 2021-09-02 ENCOUNTER — Ambulatory Visit (HOSPITAL_COMMUNITY): Payer: Managed Care, Other (non HMO) | Admitting: Certified Registered"

## 2021-09-02 DIAGNOSIS — R928 Other abnormal and inconclusive findings on diagnostic imaging of breast: Secondary | ICD-10-CM

## 2021-09-02 DIAGNOSIS — C773 Secondary and unspecified malignant neoplasm of axilla and upper limb lymph nodes: Secondary | ICD-10-CM

## 2021-09-02 DIAGNOSIS — Z9011 Acquired absence of right breast and nipple: Secondary | ICD-10-CM | POA: Insufficient documentation

## 2021-09-02 DIAGNOSIS — R2231 Localized swelling, mass and lump, right upper limb: Secondary | ICD-10-CM

## 2021-09-02 DIAGNOSIS — C50911 Malignant neoplasm of unspecified site of right female breast: Secondary | ICD-10-CM

## 2021-09-02 DIAGNOSIS — D630 Anemia in neoplastic disease: Secondary | ICD-10-CM

## 2021-09-02 DIAGNOSIS — Z170 Estrogen receptor positive status [ER+]: Secondary | ICD-10-CM

## 2021-09-02 DIAGNOSIS — F17210 Nicotine dependence, cigarettes, uncomplicated: Secondary | ICD-10-CM | POA: Diagnosis not present

## 2021-09-02 DIAGNOSIS — R2230 Localized swelling, mass and lump, unspecified upper limb: Secondary | ICD-10-CM

## 2021-09-02 HISTORY — PX: AXILLARY LYMPH NODE DISSECTION: SHX5229

## 2021-09-02 SURGERY — LYMPHADENECTOMY, AXILLARY
Anesthesia: General | Site: Axilla | Laterality: Right

## 2021-09-02 MED ORDER — CEFAZOLIN SODIUM-DEXTROSE 2-3 GM-%(50ML) IV SOLR
INTRAVENOUS | Status: DC | PRN
  Administered 2021-09-02: 2 g via INTRAVENOUS

## 2021-09-02 MED ORDER — HEMOSTATIC AGENTS (NO CHARGE) OPTIME
TOPICAL | Status: DC | PRN
  Administered 2021-09-02: 1

## 2021-09-02 MED ORDER — PHENYLEPHRINE 40 MCG/ML (10ML) SYRINGE FOR IV PUSH (FOR BLOOD PRESSURE SUPPORT)
PREFILLED_SYRINGE | INTRAVENOUS | Status: DC | PRN
  Administered 2021-09-02: 80 ug via INTRAVENOUS
  Administered 2021-09-02: 120 ug via INTRAVENOUS
  Administered 2021-09-02: 80 ug via INTRAVENOUS
  Administered 2021-09-02: 120 ug via INTRAVENOUS

## 2021-09-02 MED ORDER — CHLORHEXIDINE GLUCONATE CLOTH 2 % EX PADS: 6.0000 | MEDICATED_PAD | Freq: Once | CUTANEOUS | Status: DC

## 2021-09-02 MED ORDER — BUPIVACAINE LIPOSOME 1.3 % IJ SUSP
INTRAMUSCULAR | Status: DC | PRN
  Administered 2021-09-02: 15 mL

## 2021-09-02 MED ORDER — ONDANSETRON HCL 4 MG/2ML IJ SOLN: 4.0000 mg | Freq: Once | INTRAMUSCULAR | Status: DC | PRN

## 2021-09-02 MED ORDER — KETOROLAC TROMETHAMINE 30 MG/ML IJ SOLN
30.0000 mg | Freq: Once | INTRAMUSCULAR | Status: AC
  Administered 2021-09-02: 12:00:00 30 mg via INTRAVENOUS
  Filled 2021-09-02: qty 1

## 2021-09-02 MED ORDER — MIDAZOLAM HCL 5 MG/5ML IJ SOLN
INTRAMUSCULAR | Status: DC | PRN
  Administered 2021-09-02: 2 mg via INTRAVENOUS

## 2021-09-02 MED ORDER — BUPIVACAINE LIPOSOME 1.3 % IJ SUSP
INTRAMUSCULAR | Status: AC
  Filled 2021-09-02: qty 20

## 2021-09-02 MED ORDER — FENTANYL CITRATE PF 50 MCG/ML IJ SOSY: 25.0000 ug | PREFILLED_SYRINGE | INTRAMUSCULAR | Status: DC | PRN

## 2021-09-02 MED ORDER — HYDROCODONE-ACETAMINOPHEN 7.5-325 MG PO TABS: 1.0000 | ORAL_TABLET | ORAL | 0 refills | Status: DC | PRN

## 2021-09-02 MED ORDER — ORAL CARE MOUTH RINSE: 15.0000 mL | Freq: Once | OROMUCOSAL | Status: DC

## 2021-09-02 MED ORDER — 0.9 % SODIUM CHLORIDE (POUR BTL) OPTIME
TOPICAL | Status: DC | PRN
  Administered 2021-09-02: 09:00:00 1000 mL

## 2021-09-02 MED ORDER — CEFAZOLIN SODIUM-DEXTROSE 2-4 GM/100ML-% IV SOLN: 2.0000 g | INTRAVENOUS | Status: DC

## 2021-09-02 MED ORDER — LACTATED RINGERS IV SOLN: INTRAVENOUS | Status: DC | PRN

## 2021-09-02 MED ORDER — BUPIVACAINE LIPOSOME 1.3 % IJ SUSP
INTRAMUSCULAR | Status: AC
  Filled 2021-09-02: qty 10

## 2021-09-02 MED ORDER — DEXAMETHASONE SODIUM PHOSPHATE 10 MG/ML IJ SOLN
INTRAMUSCULAR | Status: AC
  Filled 2021-09-02: qty 1

## 2021-09-02 MED ORDER — FENTANYL CITRATE (PF) 100 MCG/2ML IJ SOLN
INTRAMUSCULAR | Status: AC
  Filled 2021-09-02: qty 2

## 2021-09-02 MED ORDER — PROPOFOL 10 MG/ML IV BOLUS
INTRAVENOUS | Status: DC | PRN
  Administered 2021-09-02: 140 mg via INTRAVENOUS

## 2021-09-02 MED ORDER — PROPOFOL 10 MG/ML IV BOLUS
INTRAVENOUS | Status: AC
  Filled 2021-09-02: qty 20

## 2021-09-02 MED ORDER — CEFAZOLIN SODIUM-DEXTROSE 2-4 GM/100ML-% IV SOLN
INTRAVENOUS | Status: AC
  Filled 2021-09-02: qty 100

## 2021-09-02 MED ORDER — LIDOCAINE HCL (PF) 2 % IJ SOLN
INTRAMUSCULAR | Status: AC
  Filled 2021-09-02: qty 5

## 2021-09-02 MED ORDER — DEXAMETHASONE SODIUM PHOSPHATE 10 MG/ML IJ SOLN
INTRAMUSCULAR | Status: DC | PRN
  Administered 2021-09-02: 10 mg via INTRAVENOUS

## 2021-09-02 MED ORDER — LACTATED RINGERS IV SOLN: INTRAVENOUS | Status: DC

## 2021-09-02 MED ORDER — CHLORHEXIDINE GLUCONATE 0.12 % MT SOLN: 15.0000 mL | Freq: Once | OROMUCOSAL | Status: DC

## 2021-09-02 MED ORDER — ONDANSETRON HCL 4 MG/2ML IJ SOLN
INTRAMUSCULAR | Status: DC | PRN
Start: 2021-09-02 — End: 2021-09-02
  Administered 2021-09-02: 4 mg via INTRAVENOUS

## 2021-09-02 MED ORDER — MIDAZOLAM HCL 2 MG/2ML IJ SOLN
INTRAMUSCULAR | Status: AC
  Filled 2021-09-02: qty 2

## 2021-09-02 MED ORDER — LIDOCAINE 2% (20 MG/ML) 5 ML SYRINGE
INTRAMUSCULAR | Status: DC | PRN
  Administered 2021-09-02: 60 mg via INTRAVENOUS

## 2021-09-02 MED ORDER — FENTANYL CITRATE (PF) 100 MCG/2ML IJ SOLN
INTRAMUSCULAR | Status: DC | PRN
  Administered 2021-09-02 (×8): 25 ug via INTRAVENOUS

## 2021-09-02 SURGICAL SUPPLY — 44 items
ADH SKN CLS APL DERMABOND .7 (GAUZE/BANDAGES/DRESSINGS) ×2
APL PRP STRL LF DISP 70% ISPRP (MISCELLANEOUS) ×1
APPLIER CLIP 9.375 SM OPEN (CLIP) ×3
APR CLP SM 9.3 20 MLT OPN (CLIP) ×1
BLADE SURG 15 STRL LF DISP TIS (BLADE) ×2 IMPLANT
BLADE SURG 15 STRL SS (BLADE) ×3
CHLORAPREP W/TINT 26 (MISCELLANEOUS) ×4 IMPLANT
CLIP APPLIE 9.375 SM OPEN (CLIP) IMPLANT
CLOTH BEACON ORANGE TIMEOUT ST (SAFETY) ×4 IMPLANT
COVER LIGHT HANDLE STERIS (MISCELLANEOUS) ×8 IMPLANT
COVER PROBE ULTRASD 5-1/2 CIV- (MISCELLANEOUS) ×4 IMPLANT
DERMABOND ADVANCED (GAUZE/BANDAGES/DRESSINGS) ×4
DERMABOND ADVANCED .7 DNX12 (GAUZE/BANDAGES/DRESSINGS) IMPLANT
DRAPE HALF SHEET 40X57 (DRAPES) ×2 IMPLANT
ELECT REM PT RETURN 9FT ADLT (ELECTROSURGICAL) ×3
ELECTRODE REM PT RTRN 9FT ADLT (ELECTROSURGICAL) ×2 IMPLANT
GAUZE 4X4 16PLY ~~LOC~~+RFID DBL (SPONGE) ×4 IMPLANT
GAUZE SPONGE 4X4 12PLY STRL (GAUZE/BANDAGES/DRESSINGS) ×4 IMPLANT
GLOVE SURG LTX SZ6.5 (GLOVE) ×4 IMPLANT
GLOVE SURG POLYISO LF SZ7.5 (GLOVE) ×6 IMPLANT
GLOVE SURG POLYISO LF SZ8 (GLOVE) ×4 IMPLANT
GLOVE SURG UNDER POLY LF SZ7 (GLOVE) ×12 IMPLANT
GLOVE SURG UNDER POLY LF SZ8.5 (GLOVE) ×2 IMPLANT
GOWN STRL REUS W/TWL LRG LVL3 (GOWN DISPOSABLE) ×12 IMPLANT
GOWN STRL REUS W/TWL XL LVL3 (GOWN DISPOSABLE) ×2 IMPLANT
INST SET MINOR GENERAL (KITS) ×4 IMPLANT
KIT TURNOVER KIT A (KITS) ×4 IMPLANT
MANIFOLD NEPTUNE II (INSTRUMENTS) ×4 IMPLANT
NDL HYPO 21X1.5 SAFETY (NEEDLE) IMPLANT
NDL SPNL 22GX3.5 QUINCKE BK (NEEDLE) IMPLANT
NEEDLE HYPO 21X1.5 SAFETY (NEEDLE) ×3 IMPLANT
NEEDLE SPNL 22GX3.5 QUINCKE BK (NEEDLE) ×3 IMPLANT
NS IRRIG 1000ML POUR BTL (IV SOLUTION) ×4 IMPLANT
PACK MINOR (CUSTOM PROCEDURE TRAY) ×4 IMPLANT
PAD ARMBOARD 7.5X6 YLW CONV (MISCELLANEOUS) ×4 IMPLANT
POWDER SURGICEL 3.0 GRAM (HEMOSTASIS) ×2 IMPLANT
SET BASIN LINEN APH (SET/KITS/TRAYS/PACK) ×4 IMPLANT
SHEARS HARMONIC 9CM CVD (BLADE) ×2 IMPLANT
SPONGE INTESTINAL PEANUT (DISPOSABLE) ×6 IMPLANT
SPONGE T-LAP 18X18 ~~LOC~~+RFID (SPONGE) ×4 IMPLANT
SUT MNCRL AB 4-0 PS2 18 (SUTURE) ×4 IMPLANT
SUT VIC AB 3-0 SH 27 (SUTURE) ×3
SUT VIC AB 3-0 SH 27X BRD (SUTURE) ×2 IMPLANT
SYR 20ML LL LF (SYRINGE) ×2 IMPLANT

## 2021-09-02 NOTE — Anesthesia Preprocedure Evaluation (Signed)
Anesthesia Evaluation  ?Patient identified by MRN, date of birth, ID band ?Patient awake ? ? ? ?Reviewed: ?Allergy & Precautions, H&P , NPO status , Patient's Chart, lab work & pertinent test results, reviewed documented beta blocker date and time  ? ?History of Anesthesia Complications ?(+) PONV, PROLONGED EMERGENCE and history of anesthetic complications ? ?Airway ?Mallampati: II ? ?TM Distance: >3 FB ?Neck ROM: full ? ? ? Dental ?no notable dental hx. ? ?  ?Pulmonary ?neg pulmonary ROS, Current Smoker and Patient abstained from smoking.,  ?  ?Pulmonary exam normal ?breath sounds clear to auscultation ? ? ? ? ? ? Cardiovascular ?Exercise Tolerance: Good ?negative cardio ROS ? ? ?Rhythm:regular Rate:Normal ? ? ?  ?Neuro/Psych ?negative neurological ROS ? negative psych ROS  ? GI/Hepatic ?negative GI ROS, Neg liver ROS,   ?Endo/Other  ?negative endocrine ROS ? Renal/GU ?negative Renal ROS  ?negative genitourinary ?  ?Musculoskeletal ? ? Abdominal ?  ?Peds ? Hematology ? ?(+) Blood dyscrasia, anemia ,   ?Anesthesia Other Findings ? ? Reproductive/Obstetrics ?negative OB ROS ? ?  ? ? ? ? ? ? ? ? ? ? ? ? ? ?  ?  ? ? ? ? ? ? ? ? ?Anesthesia Physical ?Anesthesia Plan ? ?ASA: 3 ? ?Anesthesia Plan: General and General LMA  ? ?Post-op Pain Management:   ? ?Induction:  ? ?PONV Risk Score and Plan: Ondansetron ? ?Airway Management Planned:  ? ?Additional Equipment:  ? ?Intra-op Plan:  ? ?Post-operative Plan:  ? ?Informed Consent: I have reviewed the patients History and Physical, chart, labs and discussed the procedure including the risks, benefits and alternatives for the proposed anesthesia with the patient or authorized representative who has indicated his/her understanding and acceptance.  ? ? ? ?Dental Advisory Given ? ?Plan Discussed with: CRNA ? ?Anesthesia Plan Comments:   ? ? ? ? ? ? ?Anesthesia Quick Evaluation ? ?

## 2021-09-02 NOTE — Interval H&P Note (Signed)
History and Physical Interval Note: ? ?09/02/2021 ?7:13 AM ? ?Kristin Villa  has presented today for surgery, with the diagnosis of METASTATIC BREAST CANCER-  ?EXCISION (2) LYMPH NODES.  The various methods of treatment have been discussed with the patient and family. After consideration of risks, benefits and other options for treatment, the patient has consented to  Procedure(s): ?AXILLARY LYMPH NODE DISSECTION (Right) as a surgical intervention.  The patient's history has been reviewed, patient examined, no change in status, stable for surgery.  I have reviewed the patient's chart and labs.  Questions were answered to the patient's satisfaction.   ? ? ?Aviva Signs ? ? ?

## 2021-09-02 NOTE — Transfer of Care (Signed)
Immediate Anesthesia Transfer of Care Note ? ?Patient: Kristin Villa ? ?Procedure(s) Performed: AXILLARY LYMPH NODE DISSECTION (Right: Axilla) ? ?Patient Location: PACU ? ?Anesthesia Type:General ? ?Level of Consciousness: sedated and patient cooperative ? ?Airway & Oxygen Therapy: Patient Spontanous Breathing and Patient connected to nasal cannula oxygen ? ?Post-op Assessment: Report given to RN, Post -op Vital signs reviewed and stable and Patient moving all extremities ? ?Post vital signs: Reviewed and stable ? ?Last Vitals:  ?Vitals Value Taken Time  ?BP 133/62 09/02/21 1155  ?Temp    ?Pulse 76 09/02/21 1158  ?Resp 14 09/02/21 1158  ?SpO2 97 % 09/02/21 1158  ?Vitals shown include unvalidated device data. ? ?Last Pain:  ?Vitals:  ? 09/02/21 0657  ?PainSc: 0-No pain  ?   ? ?  ? ?Complications: No notable events documented. ?

## 2021-09-02 NOTE — Op Note (Signed)
Patient:  Kristin Villa ? ?DOB:  05-Sep-1960 ? ?MRN:  [id number] ? ? ?Preop Diagnosis: Metastatic right breast cancer to right axillary lymph node ? ?Postop Diagnosis: Same ? ?Procedure: Right axillary lymph node biopsy ? ?Surgeon: Aviva Signs, MD ? ?Anes: General endotracheal ? ?Indications: Patient is a 61 year old white female status post right modified radical mastectomy who on final pathology had no lymph nodes seen.  Preoperatively, she did have a biopsy-proven right axillary lymph node.  The patient was referred back to my care for excision of the metastatic lymph node.  The risks and benefits of the procedure including bleeding, infection, pain, nerve injury, and right arm swelling were fully explained to the patient, who gave informed consent. ? ?Procedure note: The patient was placed in supine position.  After induction of general endotracheal anesthesia, the right axilla was prepped and draped using usual sterile technique with ChloraPrep.  Surgical site confirmation was performed. ? ?An incision was made in the right axilla.  The dissection was taken down to the deep axillary region.  A significant amount of scar tissue was present along with multiple clips from the previous surgery.  Initially, I was able to remove some of the excess scar tissue and what appeared to be some necrotic adipose/nodal tissue.  Initial specimen x-rays were negative for an apparent hydrophilic clip that was left in the positive lymph node.  An intraoperative ultrasound was then used to identify the enlarged lymph node.  This was high up in the axilla along the chest wall and just inferior to the axillary vein.  I was able to remove this lymph node and grossly appeared to be obvious the a lymph node.  This was confirmed by ultrasound which did not see any other lymph nodes present.  Specimen x-ray did not reveal the small hydrophilic clip.  I suspect it may have been lost in the dissection.  This was confirmed by the fact that  no other lymph nodes could be seen by ultrasound.  The thoracodorsal artery had to be ligated in order to facilitate the dissection.  No abnormal bleeding was noted at the end of the procedure.  Surgicel powder was placed into the right axilla.  The subcutaneous layer was reapproximated using a 3-0 Vicryl interrupted suture.  Exparel was instilled into the surrounding wound.  The skin was closed using a 4-0 Monocryl subcuticular suture.  Dermabond was applied. ? ?All tape and needle counts were correct at the end of the procedure.  The patient was extubated in the operating room and transferred to PACU in stable condition. ? ?Complications: None ? ?EBL: Minimal ? ?Specimen: Right axillary lymph nodes ? ? ?  ?

## 2021-09-02 NOTE — Discharge Instructions (Signed)
Keep Kristin Villa on until Sunday ?

## 2021-09-02 NOTE — Anesthesia Procedure Notes (Signed)
Procedure Name: LMA Insertion ?Date/Time: 09/02/2021 8:47 AM ?Performed by: Myna Bright, CRNA ?Pre-anesthesia Checklist: Patient identified, Emergency Drugs available, Suction available and Patient being monitored ?Patient Re-evaluated:Patient Re-evaluated prior to induction ?Oxygen Delivery Method: Circle system utilized ?Preoxygenation: Pre-oxygenation with 100% oxygen ?Induction Type: IV induction ?Ventilation: Mask ventilation without difficulty ?LMA: LMA inserted ?LMA Size: 4.0 ?Number of attempts: 1 ?Placement Confirmation: positive ETCO2 and breath sounds checked- equal and bilateral ?Tube secured with: Tape ?Dental Injury: Teeth and Oropharynx as per pre-operative assessment  ? ? ? ? ?

## 2021-09-02 NOTE — Anesthesia Postprocedure Evaluation (Signed)
Anesthesia Post Note ? ?Patient: Kristin Villa ? ?Procedure(s) Performed: AXILLARY LYMPH NODE DISSECTION (Right: Axilla) ? ?Patient location during evaluation: Phase II ?Anesthesia Type: General ?Level of consciousness: awake ?Pain management: pain level controlled ?Vital Signs Assessment: post-procedure vital signs reviewed and stable ?Respiratory status: spontaneous breathing and respiratory function stable ?Cardiovascular status: blood pressure returned to baseline and stable ?Postop Assessment: no headache and no apparent nausea or vomiting ?Anesthetic complications: no ?Comments: Late entry ? ? ?No notable events documented. ? ? ?Last Vitals:  ?Vitals:  ? 09/02/21 1245 09/02/21 1300  ?BP: (!) 153/85 (!) 158/85  ?Pulse: 88 82  ?Resp: 14 14  ?Temp:  36.7 ?C  ?SpO2: 97% 98%  ?  ?Last Pain:  ?Vitals:  ? 09/02/21 1300  ?TempSrc: Oral  ?PainSc:   ? ? ?  ?  ?  ?  ?  ?  ? ?Louann Sjogren ? ? ? ? ?

## 2021-09-02 NOTE — Progress Notes (Signed)
Patient's pre-op designated ride not answering phone. Patient able to contact another friend Vaughan Basta to transport home. Patient ambulatory to Linda's car at short stay entrance, awake and alert. ?

## 2021-09-03 ENCOUNTER — Telehealth: Payer: Self-pay | Admitting: *Deleted

## 2021-09-03 NOTE — Telephone Encounter (Signed)
Received call from patient [phone]~ telephone. ? ?Surgical Date: 09/02/2021 ?Procedure: R Axillary Dissection ? ?Concerns: Patient reports increased swelling to R Axilla. States that area is not firm, but has swollen to grapefruit size. States that area is not tender, red or warm to touch. States that edema is localized only to axilla. ? ?Denies pain, fever, chills, etc.  ? ?Advised to use ice pack and IBU for swelling. Advised if edema worsens or spreads, or if area becomes extremely painful to contact [facility] for on call surgeon, Dr. Arnoldo Morale.  ? ?Verbalized understanding.  ?

## 2021-09-04 ENCOUNTER — Telehealth (INDEPENDENT_AMBULATORY_CARE_PROVIDER_SITE_OTHER): Payer: Managed Care, Other (non HMO) | Admitting: General Surgery

## 2021-09-04 DIAGNOSIS — Z09 Encounter for follow-up examination after completed treatment for conditions other than malignant neoplasm: Secondary | ICD-10-CM

## 2021-09-04 NOTE — Telephone Encounter (Signed)
Called patient to check in on her.  She says she has had some mild swelling in the right axilla with some clear yellow fluid drainage.  She states her right arm pain has resolved.  She is not taking any narcotics for pain.  She will be following up in my office in 3 days.  Total telephone time was 5 minutes.  As this was a part of the global surgical fee, this was not a billable visit. ?

## 2021-09-06 ENCOUNTER — Encounter (HOSPITAL_COMMUNITY): Payer: Self-pay | Admitting: General Surgery

## 2021-09-06 LAB — SURGICAL PATHOLOGY

## 2021-09-07 ENCOUNTER — Encounter: Payer: Managed Care, Other (non HMO) | Admitting: General Surgery

## 2021-09-07 ENCOUNTER — Ambulatory Visit (INDEPENDENT_AMBULATORY_CARE_PROVIDER_SITE_OTHER): Payer: Managed Care, Other (non HMO) | Admitting: General Surgery

## 2021-09-07 ENCOUNTER — Encounter: Payer: Self-pay | Admitting: General Surgery

## 2021-09-07 ENCOUNTER — Other Ambulatory Visit: Payer: Self-pay

## 2021-09-07 VITALS — BP 122/75 | HR 89 | Temp 97.5°F | Resp 14 | Ht 67.0 in | Wt 150.0 lb

## 2021-09-07 DIAGNOSIS — Z09 Encounter for follow-up examination after completed treatment for conditions other than malignant neoplasm: Secondary | ICD-10-CM

## 2021-09-07 NOTE — Progress Notes (Signed)
Subjective:  ?  ? Kristin Villa  ?Patient here for follow-up, status post right axillary lymph node biopsy.  Patient's had some swelling in the underarm, but she has no pain or swelling in the right arm.  She is not taking any pain pills. ?Objective:  ? ? BP 122/75   Pulse 89   Temp (!) 97.5 ?F (36.4 ?C) (Other (Comment))   Resp 14   Ht '5\' 7"'$  (1.702 m)   Wt 150 lb (68 kg)   SpO2 96%   BMI 23.49 kg/m?  ? ?General:  alert, cooperative, and no distress  ?Right axillary incision healing well.  She does have swelling in the right axilla, but is not tense.  Resolving ecchymosis is noted.  No right winged scapula noted. ?Final pathology revealed 5 out of 5 positive lymph nodes with extranodal nodal connective tissue involvement.  This was told to the patient. ?   ? ?Assessment:  ? ? Doing well postoperatively. ?Postoperative fluid collection in the wound which the patient refuses needle aspiration  ?  ?Plan:  ? ?I have contacted [facility] oncologist, Milagros Loll, MD, about the pathology results.  They will follow up with patient.  Follow-up here as needed. ?

## 2021-09-09 NOTE — Telephone Encounter (Signed)
Additional paperwork completed for long out of work time. Patient to be out of work until 09/06/2021 at which time she can return to work unrestricted. Faxed to [phone] and received confirmation.   ? ?Patient out of work from 07/16/21 - 09-06-2021 ?

## 2022-05-03 ENCOUNTER — Encounter: Payer: Self-pay | Admitting: *Deleted

## 2022-05-03 ENCOUNTER — Other Ambulatory Visit: Payer: Self-pay | Admitting: *Deleted

## 2022-05-03 ENCOUNTER — Ambulatory Visit
Admission: RE | Admit: 2022-05-03 | Discharge: 2022-05-03 | Disposition: A | Discharge status: Home / Self Care | Payer: Managed Care, Other (non HMO) | Source: Ambulatory Visit | Attending: Radiation Oncology | Admitting: Radiation Oncology

## 2022-05-03 ENCOUNTER — Encounter: Payer: Self-pay | Admitting: Radiation Oncology

## 2022-05-03 VITALS — BP 114/61 | HR 93 | Temp 97.0°F | Resp 16 | Ht 67.0 in | Wt 149.0 lb

## 2022-05-03 DIAGNOSIS — Z170 Estrogen receptor positive status [ER+]: Secondary | ICD-10-CM | POA: Insufficient documentation

## 2022-05-03 DIAGNOSIS — C50411 Malignant neoplasm of upper-outer quadrant of right female breast: Secondary | ICD-10-CM | POA: Insufficient documentation

## 2022-05-03 DIAGNOSIS — C773 Secondary and unspecified malignant neoplasm of axilla and upper limb lymph nodes: Secondary | ICD-10-CM | POA: Insufficient documentation

## 2022-05-03 DIAGNOSIS — C50911 Malignant neoplasm of unspecified site of right female breast: Secondary | ICD-10-CM

## 2022-05-03 DIAGNOSIS — Z510 Encounter for antineoplastic radiation therapy: Secondary | ICD-10-CM | POA: Insufficient documentation

## 2022-05-03 DIAGNOSIS — F17210 Nicotine dependence, cigarettes, uncomplicated: Secondary | ICD-10-CM | POA: Insufficient documentation

## 2022-05-03 NOTE — Progress Notes (Signed)
Met patient at her initial consultation with Dr. Chrystal.   She received chemo at [facility] in [place] but would like radiation closer to her work.   

## 2022-05-03 NOTE — Consult Note (Signed)
NEW PATIENT EVALUATION  Name: Kristin Villa  MRN: [id number]  Date:   05/03/2022     DOB: 1961-06-18   This 61 y.o. 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  Patient also has history of Clark's level 4 superficial spreading melanoma.  Patient also had lobular carcinoma of the left breast status post mastectomy.Marland Kitchen  REFERRING PHYSICIAN: Wanita Chamberlain, PA-C  CHIEF COMPLAINT:  Chief Complaint  Patient presents with   Breast Cancer    Consult    DIAGNOSIS: The encounter diagnosis was Breast cancer metastasized to axillary lymph node, right ([place]).   PREVIOUS INVESTIGATIONS:  Clinical notes reviewed Pathology reports reviewed Labs reviewed  HPI: Patient is a 61 year old female who presented with self discovered mass in her right breast.  This was back in November 2022.  There was suspicious palpable 1.2 cm mass in the right breast at the 11 o'clock position.  She also does have abnormal lymph node in the right axilla close to the axillary vessels.  She underwent underwent ultrasound-guided biopsy positive for invasive lobular carcinoma.  Bone scan in April showed no evidence to suggest metastatic disease.  She was noted on CT scan to have a 3.4 cm enhancing exophytic left superior pole mass consistent with renal cell carcinoma.  She underwent bilateral mastectomy with the right breast showing 6 foci of invasive lobular carcinoma ranging from less than 1 mm to 2 mm with positive deep margin.  She had 1 positive node out of 1 in the right axilla with extracapsular extension greater than 2 mm with a 14 mm deposit.  Left breast showed invasive lobular carcinoma tumor size 2 mm and less than 1 mm with negative margins.  She went to [facility] where she had a axillary dissection showing 5 lymph nodes  with macro metastatic disease 1 lymph node with micrometastatic disease out of 7 examined.  Tumors were overall grade 2.  She has undergone dose dense chemotherapy with AC x4 as well as recently received her fourth cycle of Taxol.  Based on her axillary involvement as well as positive deep margin she is now referred to radiation oncology for consideration of treatment.  Overall she is doing well specifically denies chest wall pain bone pain or cough.  She is concerned about possible systemic disease.  PLANNED TREATMENT REGIMEN: Right chest wall and peripheral lymphatic radiation  PAST MEDICAL HISTORY:  has a past medical history of Anemia, Complication of anesthesia, Hyperlipidemia, PONV (postoperative nausea and vomiting), SCC (squamous cell carcinoma), and Tachycardia.    PAST SURGICAL HISTORY:  Past Surgical History:  Procedure Laterality Date   AXILLARY LYMPH NODE DISSECTION Right 09/02/2021   Procedure: AXILLARY LYMPH NODE DISSECTION;  Surgeon: Aviva Signs, MD;  Location: [facility];  Service: General;  Laterality: Right;   BREAST BIOPSY Right    x3   CATARACT EXTRACTION W/PHACO Right 07/27/2021   Procedure: CATARACT EXTRACTION PHACO AND INTRAOCULAR LENS PLACEMENT ([place]) RIGHT 7.43 00:35.1;  Surgeon: Birder Robson, MD;  Location: [facility];  Service: Ophthalmology;  Laterality: Right;  needs later arrival   CATARACT EXTRACTION W/PHACO Left 08/10/2021   Procedure: CATARACT EXTRACTION PHACO AND INTRAOCULAR LENS PLACEMENT ([facility]) LEFT 7.59 00:42.1;  Surgeon: Birder Robson, MD;  Location: [facility];  Service: Ophthalmology;  Laterality: Left;   MASTECTOMY MODIFIED RADICAL Right  07/16/2021   Procedure: MASTECTOMY MODIFIED RADICAL;  Surgeon: Aviva Signs, MD;  Location: [facility];  Service: General;  Laterality: Right;    FAMILY HISTORY: family history includes Cancer in her maternal grandfather and maternal grandmother; Dementia in her father.  SOCIAL HISTORY:  reports that she  has been smoking cigarettes. She has a 22.50 pack-year smoking history. She has never used smokeless tobacco. She reports that she does not drink alcohol and does not use drugs.  ALLERGIES: Gadolinium derivatives  MEDICATIONS:  Current Outpatient Medications  Medication Sig Dispense Refill   acetaminophen (TYLENOL) 650 MG CR tablet Take 650 mg by mouth every 8 (eight) hours as needed for pain.     cyclobenzaprine (FLEXERIL) 10 MG tablet Take 5 mg by mouth 3 (three) times daily as needed for muscle spasms.     HYDROcodone-acetaminophen (NORCO) 7.5-325 MG tablet Take 1 tablet by mouth every 4 (four) hours as needed for moderate pain. (Patient not taking: Reported on 09/07/2021) 30 tablet 0   metoprolol succinate (TOPROL-XL) 25 MG 24 hr tablet Take 25 mg by mouth daily.     rosuvastatin (CRESTOR) 5 MG tablet Take 5 mg by mouth daily.     No current facility-administered medications for this encounter.    ECOG PERFORMANCE STATUS:  0 - Asymptomatic  REVIEW OF SYSTEMS: Patient has history of renal cell carcinoma which is currently being followed although she is opted for nephrectomy.  She also has a Clark's level 4 melanoma. Patient denies any weight loss, fatigue, weakness, fever, chills or night sweats. Patient denies any loss of vision, blurred vision. Patient denies any ringing  of the ears or hearing loss. No irregular heartbeat. Patient denies heart murmur or history of fainting. Patient denies any chest pain or pain radiating to her upper extremities. Patient denies any shortness of breath, difficulty breathing at night, cough or hemoptysis. Patient denies any swelling in the lower legs. Patient denies any nausea vomiting, vomiting of blood, or coffee ground material in the vomitus. Patient denies any stomach pain. Patient states has had normal bowel movements no significant constipation or diarrhea. Patient denies any dysuria, hematuria or significant nocturia. Patient denies any problems  walking, swelling in the joints or loss of balance. Patient denies any skin changes, loss of hair or loss of weight. Patient denies any excessive worrying or anxiety or significant depression. Patient denies any problems with insomnia. Patient denies excessive thirst, polyuria, polydipsia. Patient denies any swollen glands, patient denies easy bruising or easy bleeding. Patient denies any recent infections, allergies or URI. Patient "s visual fields have not changed significantly in recent time.   PHYSICAL EXAM: BP 114/61   Pulse 93   Temp (!) 97 F (36.1 C)   Resp 16   Ht _0  (1.702 m)   Wt 149 lb (67.6 kg)   BMI 23.34 kg/m  Patient status post bilateral mastectomies.  Chest wall are clear without evidence of mass or nodularity.  No axillary or supraclavicular adenopathy is identified.  No evidence of lymphedema in her right upper extremity is noted.  Well-developed well-nourished patient in NAD. HEENT reveals PERLA, EOMI, discs not visualized.  Oral cavity is clear. No oral mucosal lesions are identified. Neck is clear without evidence of cervical or supraclavicular adenopathy. Lungs are clear to A&P. Cardiac examination is essentially unremarkable with regular rate and rhythm without murmur rub or thrill. Abdomen is benign with no organomegaly or masses noted. Motor sensory and DTR levels are equal and symmetric in the  upper and lower extremities. Cranial nerves II through XII are grossly intact. Proprioception is intact. No peripheral adenopathy or edema is identified. No motor or sensory levels are noted. Crude visual fields are within normal range.  LABORATORY DATA: Labs reviewed    RADIOLOGY RESULTS: Mammogram CT scans bone scan have been requested for my review PET scan has been ordered   IMPRESSION: Stage Ib invasive lobular carcinoma of the right breast with positive deep margin and multiple lymph nodes involved necessitating right chest wall and peripheral lymphatic radiation and  postmastectomy patient 61 years of age.  PLAN: At this time I have ordered a PET CT scan to delineate any possibility of distant metastatic disease or residual disease in her breast lymph node chain.  I have recommended postmastectomy radiation therapy would.  Would plan on delivering 5040 cGy in 28 fractions to her right chest wall and peripheral lymphatics.  Would also boost her scar another 1000 cGy using electron beam to her scar.  Risks and benefits of treatment including skin reaction fatigue alteration of blood counts possible inclusion of superficial lung and chance of lymphedema of the right upper extremity all were reviewed in detail with the patient.  I have ordered a PET CT scan and have set her up for CT simulation for next week.  Patient comprehends my recommendations well.  I would like to take this opportunity to thank you for allowing me to participate in the care of your patient.Noreene Filbert, MD

## 2022-05-09 ENCOUNTER — Ambulatory Visit
Admission: RE | Admit: 2022-05-09 | Discharge: 2022-05-09 | Disposition: A | Discharge status: Home / Self Care | Payer: Managed Care, Other (non HMO) | Source: Ambulatory Visit | Attending: Radiation Oncology | Admitting: Radiation Oncology

## 2022-05-09 DIAGNOSIS — Z853 Personal history of malignant neoplasm of breast: Secondary | ICD-10-CM | POA: Insufficient documentation

## 2022-05-09 DIAGNOSIS — Z9011 Acquired absence of right breast and nipple: Secondary | ICD-10-CM | POA: Insufficient documentation

## 2022-05-09 DIAGNOSIS — C50911 Malignant neoplasm of unspecified site of right female breast: Secondary | ICD-10-CM

## 2022-05-09 DIAGNOSIS — C642 Malignant neoplasm of left kidney, except renal pelvis: Secondary | ICD-10-CM | POA: Diagnosis not present

## 2022-05-09 DIAGNOSIS — Z9221 Personal history of antineoplastic chemotherapy: Secondary | ICD-10-CM | POA: Insufficient documentation

## 2022-05-09 LAB — GLUCOSE, CAPILLARY: Glucose-Capillary: 89 mg/dL (ref 70–99)

## 2022-05-09 MED ORDER — FLUDEOXYGLUCOSE F - 18 (FDG) INJECTION
7.8200 | Freq: Once | INTRAVENOUS | Status: AC
  Administered 2022-05-09: 7.82 via INTRAVENOUS

## 2022-05-11 ENCOUNTER — Ambulatory Visit
Admission: RE | Admit: 2022-05-11 | Discharge: 2022-05-11 | Disposition: A | Discharge status: Home / Self Care | Payer: Managed Care, Other (non HMO) | Source: Ambulatory Visit | Attending: Radiation Oncology | Admitting: Radiation Oncology

## 2022-05-11 DIAGNOSIS — Z170 Estrogen receptor positive status [ER+]: Secondary | ICD-10-CM | POA: Diagnosis not present

## 2022-05-11 DIAGNOSIS — F17210 Nicotine dependence, cigarettes, uncomplicated: Secondary | ICD-10-CM | POA: Diagnosis not present

## 2022-05-11 DIAGNOSIS — C773 Secondary and unspecified malignant neoplasm of axilla and upper limb lymph nodes: Secondary | ICD-10-CM | POA: Diagnosis not present

## 2022-05-11 DIAGNOSIS — Z510 Encounter for antineoplastic radiation therapy: Secondary | ICD-10-CM | POA: Diagnosis present

## 2022-05-11 DIAGNOSIS — C50411 Malignant neoplasm of upper-outer quadrant of right female breast: Secondary | ICD-10-CM | POA: Diagnosis not present

## 2022-05-17 DIAGNOSIS — Z510 Encounter for antineoplastic radiation therapy: Secondary | ICD-10-CM | POA: Diagnosis not present

## 2022-05-23 ENCOUNTER — Ambulatory Visit
Admission: RE | Admit: 2022-05-23 | Discharge: 2022-05-23 | Disposition: A | Discharge status: Home / Self Care | Payer: Managed Care, Other (non HMO) | Source: Ambulatory Visit | Attending: Radiation Oncology | Admitting: Radiation Oncology

## 2022-05-23 DIAGNOSIS — Z510 Encounter for antineoplastic radiation therapy: Secondary | ICD-10-CM | POA: Diagnosis not present

## 2022-05-24 ENCOUNTER — Ambulatory Visit
Admission: RE | Admit: 2022-05-24 | Discharge: 2022-05-24 | Disposition: A | Discharge status: Home / Self Care | Payer: Managed Care, Other (non HMO) | Source: Ambulatory Visit | Attending: Radiation Oncology | Admitting: Radiation Oncology

## 2022-05-24 ENCOUNTER — Other Ambulatory Visit: Payer: Self-pay

## 2022-05-24 DIAGNOSIS — Z510 Encounter for antineoplastic radiation therapy: Secondary | ICD-10-CM | POA: Diagnosis not present

## 2022-05-24 LAB — RAD ONC ARIA SESSION SUMMARY
Course Elapsed Days: 0
Plan Fractions Treated to Date: 1
Plan Prescribed Dose Per Fraction: 1.8 Gy
Plan Total Fractions Prescribed: 28
Plan Total Prescribed Dose: 50.4 Gy
Reference Point Dosage Given to Date: 1.8 Gy
Reference Point Session Dosage Given: 1.8 Gy
Session Number: 1

## 2022-05-25 ENCOUNTER — Ambulatory Visit
Admission: RE | Admit: 2022-05-25 | Discharge: 2022-05-25 | Disposition: A | Discharge status: Home / Self Care | Payer: Managed Care, Other (non HMO) | Source: Ambulatory Visit | Attending: Radiation Oncology | Admitting: Radiation Oncology

## 2022-05-25 ENCOUNTER — Other Ambulatory Visit: Payer: Self-pay

## 2022-05-25 DIAGNOSIS — Z510 Encounter for antineoplastic radiation therapy: Secondary | ICD-10-CM | POA: Diagnosis not present

## 2022-05-25 LAB — RAD ONC ARIA SESSION SUMMARY
Course Elapsed Days: 1
Plan Fractions Treated to Date: 2
Plan Prescribed Dose Per Fraction: 1.8 Gy
Plan Total Fractions Prescribed: 28
Plan Total Prescribed Dose: 50.4 Gy
Reference Point Dosage Given to Date: 3.6 Gy
Reference Point Session Dosage Given: 1.8 Gy
Session Number: 2

## 2022-05-26 ENCOUNTER — Other Ambulatory Visit: Payer: Self-pay

## 2022-05-26 ENCOUNTER — Ambulatory Visit
Admission: RE | Admit: 2022-05-26 | Discharge: 2022-05-26 | Disposition: A | Discharge status: Home / Self Care | Payer: Managed Care, Other (non HMO) | Source: Ambulatory Visit | Attending: Radiation Oncology | Admitting: Radiation Oncology

## 2022-05-26 DIAGNOSIS — Z510 Encounter for antineoplastic radiation therapy: Secondary | ICD-10-CM | POA: Diagnosis not present

## 2022-05-26 LAB — RAD ONC ARIA SESSION SUMMARY
Course Elapsed Days: 2
Plan Fractions Treated to Date: 3
Plan Prescribed Dose Per Fraction: 1.8 Gy
Plan Total Fractions Prescribed: 28
Plan Total Prescribed Dose: 50.4 Gy
Reference Point Dosage Given to Date: 5.4 Gy
Reference Point Session Dosage Given: 1.8 Gy
Session Number: 3

## 2022-05-27 ENCOUNTER — Other Ambulatory Visit: Payer: Self-pay

## 2022-05-27 ENCOUNTER — Ambulatory Visit
Admission: RE | Admit: 2022-05-27 | Discharge: 2022-05-27 | Disposition: A | Discharge status: Home / Self Care | Payer: Managed Care, Other (non HMO) | Source: Ambulatory Visit | Attending: Radiation Oncology | Admitting: Radiation Oncology

## 2022-05-27 DIAGNOSIS — C773 Secondary and unspecified malignant neoplasm of axilla and upper limb lymph nodes: Secondary | ICD-10-CM | POA: Insufficient documentation

## 2022-05-27 DIAGNOSIS — C50411 Malignant neoplasm of upper-outer quadrant of right female breast: Secondary | ICD-10-CM | POA: Diagnosis not present

## 2022-05-27 DIAGNOSIS — Z510 Encounter for antineoplastic radiation therapy: Secondary | ICD-10-CM | POA: Diagnosis not present

## 2022-05-27 DIAGNOSIS — C778 Secondary and unspecified malignant neoplasm of lymph nodes of multiple regions: Secondary | ICD-10-CM | POA: Diagnosis not present

## 2022-05-27 DIAGNOSIS — Z170 Estrogen receptor positive status [ER+]: Secondary | ICD-10-CM | POA: Insufficient documentation

## 2022-05-27 DIAGNOSIS — F17210 Nicotine dependence, cigarettes, uncomplicated: Secondary | ICD-10-CM | POA: Insufficient documentation

## 2022-05-27 LAB — RAD ONC ARIA SESSION SUMMARY
Course Elapsed Days: 3
Plan Fractions Treated to Date: 4
Plan Prescribed Dose Per Fraction: 1.8 Gy
Plan Total Fractions Prescribed: 28
Plan Total Prescribed Dose: 50.4 Gy
Reference Point Dosage Given to Date: 7.2 Gy
Reference Point Session Dosage Given: 1.8 Gy
Session Number: 4

## 2022-05-30 ENCOUNTER — Ambulatory Visit
Admission: RE | Admit: 2022-05-30 | Discharge: 2022-05-30 | Disposition: A | Discharge status: Home / Self Care | Payer: Managed Care, Other (non HMO) | Source: Ambulatory Visit | Attending: Radiation Oncology | Admitting: Radiation Oncology

## 2022-05-30 ENCOUNTER — Other Ambulatory Visit: Payer: Self-pay

## 2022-05-30 DIAGNOSIS — Z510 Encounter for antineoplastic radiation therapy: Secondary | ICD-10-CM | POA: Diagnosis not present

## 2022-05-30 LAB — RAD ONC ARIA SESSION SUMMARY
Course Elapsed Days: 6
Plan Fractions Treated to Date: 5
Plan Prescribed Dose Per Fraction: 1.8 Gy
Plan Total Fractions Prescribed: 28
Plan Total Prescribed Dose: 50.4 Gy
Reference Point Dosage Given to Date: 9 Gy
Reference Point Session Dosage Given: 1.8 Gy
Session Number: 5

## 2022-05-31 ENCOUNTER — Other Ambulatory Visit: Payer: Self-pay

## 2022-05-31 ENCOUNTER — Ambulatory Visit
Admission: RE | Admit: 2022-05-31 | Discharge: 2022-05-31 | Disposition: A | Discharge status: Home / Self Care | Payer: Managed Care, Other (non HMO) | Source: Ambulatory Visit | Attending: Radiation Oncology | Admitting: Radiation Oncology

## 2022-05-31 DIAGNOSIS — Z510 Encounter for antineoplastic radiation therapy: Secondary | ICD-10-CM | POA: Diagnosis not present

## 2022-05-31 LAB — RAD ONC ARIA SESSION SUMMARY
Course Elapsed Days: 7
Plan Fractions Treated to Date: 6
Plan Prescribed Dose Per Fraction: 1.8 Gy
Plan Total Fractions Prescribed: 28
Plan Total Prescribed Dose: 50.4 Gy
Reference Point Dosage Given to Date: 10.8 Gy
Reference Point Session Dosage Given: 1.8 Gy
Session Number: 6

## 2022-06-01 ENCOUNTER — Other Ambulatory Visit: Payer: Self-pay

## 2022-06-01 ENCOUNTER — Ambulatory Visit
Admission: RE | Admit: 2022-06-01 | Discharge: 2022-06-01 | Disposition: A | Discharge status: Home / Self Care | Payer: Managed Care, Other (non HMO) | Source: Ambulatory Visit | Attending: Radiation Oncology | Admitting: Radiation Oncology

## 2022-06-01 DIAGNOSIS — Z510 Encounter for antineoplastic radiation therapy: Secondary | ICD-10-CM | POA: Diagnosis not present

## 2022-06-01 LAB — RAD ONC ARIA SESSION SUMMARY
Course Elapsed Days: 8
Plan Fractions Treated to Date: 7
Plan Prescribed Dose Per Fraction: 1.8 Gy
Plan Total Fractions Prescribed: 28
Plan Total Prescribed Dose: 50.4 Gy
Reference Point Dosage Given to Date: 12.6 Gy
Reference Point Session Dosage Given: 1.8 Gy
Session Number: 7

## 2022-06-02 ENCOUNTER — Other Ambulatory Visit: Payer: Self-pay | Admitting: *Deleted

## 2022-06-02 ENCOUNTER — Ambulatory Visit
Admission: RE | Admit: 2022-06-02 | Discharge: 2022-06-02 | Disposition: A | Discharge status: Home / Self Care | Payer: Managed Care, Other (non HMO) | Source: Ambulatory Visit | Attending: Radiation Oncology | Admitting: Radiation Oncology

## 2022-06-02 ENCOUNTER — Other Ambulatory Visit: Payer: Self-pay

## 2022-06-02 DIAGNOSIS — C50911 Malignant neoplasm of unspecified site of right female breast: Secondary | ICD-10-CM

## 2022-06-02 DIAGNOSIS — Z510 Encounter for antineoplastic radiation therapy: Secondary | ICD-10-CM | POA: Diagnosis not present

## 2022-06-02 LAB — RAD ONC ARIA SESSION SUMMARY
Course Elapsed Days: 9
Plan Fractions Treated to Date: 8
Plan Prescribed Dose Per Fraction: 1.8 Gy
Plan Total Fractions Prescribed: 28
Plan Total Prescribed Dose: 50.4 Gy
Reference Point Dosage Given to Date: 14.4 Gy
Reference Point Session Dosage Given: 1.8 Gy
Session Number: 8

## 2022-06-03 ENCOUNTER — Ambulatory Visit
Admission: RE | Admit: 2022-06-03 | Discharge: 2022-06-03 | Disposition: A | Discharge status: Home / Self Care | Payer: Managed Care, Other (non HMO) | Source: Ambulatory Visit | Attending: Radiation Oncology | Admitting: Radiation Oncology

## 2022-06-03 ENCOUNTER — Other Ambulatory Visit: Payer: Self-pay

## 2022-06-03 DIAGNOSIS — Z510 Encounter for antineoplastic radiation therapy: Secondary | ICD-10-CM | POA: Diagnosis not present

## 2022-06-03 LAB — RAD ONC ARIA SESSION SUMMARY
Course Elapsed Days: 10
Plan Fractions Treated to Date: 9
Plan Prescribed Dose Per Fraction: 1.8 Gy
Plan Total Fractions Prescribed: 28
Plan Total Prescribed Dose: 50.4 Gy
Reference Point Dosage Given to Date: 16.2 Gy
Reference Point Session Dosage Given: 1.8 Gy
Session Number: 9

## 2022-06-06 ENCOUNTER — Other Ambulatory Visit: Payer: Self-pay

## 2022-06-06 ENCOUNTER — Ambulatory Visit
Admission: RE | Admit: 2022-06-06 | Discharge: 2022-06-06 | Disposition: A | Discharge status: Home / Self Care | Payer: Managed Care, Other (non HMO) | Source: Ambulatory Visit | Attending: Radiation Oncology | Admitting: Radiation Oncology

## 2022-06-06 DIAGNOSIS — Z510 Encounter for antineoplastic radiation therapy: Secondary | ICD-10-CM | POA: Diagnosis not present

## 2022-06-06 LAB — RAD ONC ARIA SESSION SUMMARY
Course Elapsed Days: 13
Plan Fractions Treated to Date: 10
Plan Prescribed Dose Per Fraction: 1.8 Gy
Plan Total Fractions Prescribed: 28
Plan Total Prescribed Dose: 50.4 Gy
Reference Point Dosage Given to Date: 18 Gy
Reference Point Session Dosage Given: 1.8 Gy
Session Number: 10

## 2022-06-07 ENCOUNTER — Ambulatory Visit
Admission: RE | Admit: 2022-06-07 | Discharge: 2022-06-07 | Disposition: A | Discharge status: Home / Self Care | Payer: Managed Care, Other (non HMO) | Source: Ambulatory Visit | Attending: Radiation Oncology | Admitting: Radiation Oncology

## 2022-06-07 ENCOUNTER — Other Ambulatory Visit: Payer: Self-pay

## 2022-06-07 DIAGNOSIS — Z510 Encounter for antineoplastic radiation therapy: Secondary | ICD-10-CM | POA: Diagnosis not present

## 2022-06-07 LAB — RAD ONC ARIA SESSION SUMMARY
Course Elapsed Days: 14
Plan Fractions Treated to Date: 11
Plan Prescribed Dose Per Fraction: 1.8 Gy
Plan Total Fractions Prescribed: 28
Plan Total Prescribed Dose: 50.4 Gy
Reference Point Dosage Given to Date: 19.8 Gy
Reference Point Session Dosage Given: 1.8 Gy
Session Number: 11

## 2022-06-08 ENCOUNTER — Other Ambulatory Visit: Payer: Self-pay

## 2022-06-08 ENCOUNTER — Ambulatory Visit
Admission: RE | Admit: 2022-06-08 | Discharge: 2022-06-08 | Disposition: A | Discharge status: Home / Self Care | Payer: Managed Care, Other (non HMO) | Source: Ambulatory Visit | Attending: Radiation Oncology | Admitting: Radiation Oncology

## 2022-06-08 ENCOUNTER — Inpatient Hospital Stay: Payer: Managed Care, Other (non HMO) | Attending: Radiation Oncology

## 2022-06-08 DIAGNOSIS — C778 Secondary and unspecified malignant neoplasm of lymph nodes of multiple regions: Secondary | ICD-10-CM | POA: Insufficient documentation

## 2022-06-08 DIAGNOSIS — C50911 Malignant neoplasm of unspecified site of right female breast: Secondary | ICD-10-CM

## 2022-06-08 DIAGNOSIS — Z170 Estrogen receptor positive status [ER+]: Secondary | ICD-10-CM | POA: Insufficient documentation

## 2022-06-08 DIAGNOSIS — C50411 Malignant neoplasm of upper-outer quadrant of right female breast: Secondary | ICD-10-CM | POA: Insufficient documentation

## 2022-06-08 DIAGNOSIS — Z510 Encounter for antineoplastic radiation therapy: Secondary | ICD-10-CM | POA: Insufficient documentation

## 2022-06-08 LAB — CBC
HCT: 40.7 % (ref 36.0–46.0)
Hemoglobin: 13.3 g/dL (ref 12.0–15.0)
MCH: 32.8 pg (ref 26.0–34.0)
MCHC: 32.7 g/dL (ref 30.0–36.0)
MCV: 100.5 fL — ABNORMAL HIGH (ref 80.0–100.0)
Platelets: 180 10*3/uL (ref 150–400)
RBC: 4.05 MIL/uL (ref 3.87–5.11)
RDW: 12.7 % (ref 11.5–15.5)
WBC: 7.2 10*3/uL (ref 4.0–10.5)
nRBC: 0 % (ref 0.0–0.2)

## 2022-06-08 LAB — RAD ONC ARIA SESSION SUMMARY
Course Elapsed Days: 15
Plan Fractions Treated to Date: 12
Plan Prescribed Dose Per Fraction: 1.8 Gy
Plan Total Fractions Prescribed: 28
Plan Total Prescribed Dose: 50.4 Gy
Reference Point Dosage Given to Date: 21.6 Gy
Reference Point Session Dosage Given: 1.8 Gy
Session Number: 12

## 2022-06-09 ENCOUNTER — Other Ambulatory Visit: Payer: Self-pay

## 2022-06-09 ENCOUNTER — Ambulatory Visit
Admission: RE | Admit: 2022-06-09 | Discharge: 2022-06-09 | Disposition: A | Discharge status: Home / Self Care | Payer: Managed Care, Other (non HMO) | Source: Ambulatory Visit | Attending: Radiation Oncology | Admitting: Radiation Oncology

## 2022-06-09 DIAGNOSIS — Z510 Encounter for antineoplastic radiation therapy: Secondary | ICD-10-CM | POA: Diagnosis not present

## 2022-06-09 LAB — RAD ONC ARIA SESSION SUMMARY
Course Elapsed Days: 16
Plan Fractions Treated to Date: 13
Plan Prescribed Dose Per Fraction: 1.8 Gy
Plan Total Fractions Prescribed: 28
Plan Total Prescribed Dose: 50.4 Gy
Reference Point Dosage Given to Date: 23.4 Gy
Reference Point Session Dosage Given: 1.8 Gy
Session Number: 13

## 2022-06-10 ENCOUNTER — Ambulatory Visit
Admission: RE | Admit: 2022-06-10 | Discharge: 2022-06-10 | Disposition: A | Discharge status: Home / Self Care | Payer: Managed Care, Other (non HMO) | Source: Ambulatory Visit | Attending: Radiation Oncology | Admitting: Radiation Oncology

## 2022-06-10 ENCOUNTER — Other Ambulatory Visit: Payer: Self-pay

## 2022-06-10 DIAGNOSIS — Z510 Encounter for antineoplastic radiation therapy: Secondary | ICD-10-CM | POA: Diagnosis not present

## 2022-06-10 LAB — RAD ONC ARIA SESSION SUMMARY
Course Elapsed Days: 17
Plan Fractions Treated to Date: 14
Plan Prescribed Dose Per Fraction: 1.8 Gy
Plan Total Fractions Prescribed: 28
Plan Total Prescribed Dose: 50.4 Gy
Reference Point Dosage Given to Date: 25.2 Gy
Reference Point Session Dosage Given: 1.8 Gy
Session Number: 14

## 2022-06-13 ENCOUNTER — Ambulatory Visit
Admission: RE | Admit: 2022-06-13 | Discharge: 2022-06-13 | Disposition: A | Discharge status: Home / Self Care | Payer: Managed Care, Other (non HMO) | Source: Ambulatory Visit | Attending: Radiation Oncology | Admitting: Radiation Oncology

## 2022-06-13 ENCOUNTER — Other Ambulatory Visit: Payer: Self-pay

## 2022-06-13 DIAGNOSIS — Z510 Encounter for antineoplastic radiation therapy: Secondary | ICD-10-CM | POA: Diagnosis not present

## 2022-06-13 LAB — RAD ONC ARIA SESSION SUMMARY
Course Elapsed Days: 20
Plan Fractions Treated to Date: 15
Plan Prescribed Dose Per Fraction: 1.8 Gy
Plan Total Fractions Prescribed: 28
Plan Total Prescribed Dose: 50.4 Gy
Reference Point Dosage Given to Date: 27 Gy
Reference Point Session Dosage Given: 1.8 Gy
Session Number: 15

## 2022-06-14 ENCOUNTER — Ambulatory Visit
Admission: RE | Admit: 2022-06-14 | Discharge: 2022-06-14 | Disposition: A | Discharge status: Home / Self Care | Payer: Managed Care, Other (non HMO) | Source: Ambulatory Visit | Attending: Radiation Oncology | Admitting: Radiation Oncology

## 2022-06-14 ENCOUNTER — Other Ambulatory Visit: Payer: Self-pay

## 2022-06-14 DIAGNOSIS — Z510 Encounter for antineoplastic radiation therapy: Secondary | ICD-10-CM | POA: Diagnosis not present

## 2022-06-14 LAB — RAD ONC ARIA SESSION SUMMARY
Course Elapsed Days: 21
Plan Fractions Treated to Date: 16
Plan Prescribed Dose Per Fraction: 1.8 Gy
Plan Total Fractions Prescribed: 28
Plan Total Prescribed Dose: 50.4 Gy
Reference Point Dosage Given to Date: 28.8 Gy
Reference Point Session Dosage Given: 1.8 Gy
Session Number: 16

## 2022-06-15 ENCOUNTER — Other Ambulatory Visit: Payer: Self-pay

## 2022-06-15 ENCOUNTER — Ambulatory Visit
Admission: RE | Admit: 2022-06-15 | Discharge: 2022-06-15 | Disposition: A | Discharge status: Home / Self Care | Payer: Managed Care, Other (non HMO) | Source: Ambulatory Visit | Attending: Radiation Oncology | Admitting: Radiation Oncology

## 2022-06-15 DIAGNOSIS — Z510 Encounter for antineoplastic radiation therapy: Secondary | ICD-10-CM | POA: Diagnosis not present

## 2022-06-15 LAB — RAD ONC ARIA SESSION SUMMARY
Course Elapsed Days: 22
Plan Fractions Treated to Date: 17
Plan Prescribed Dose Per Fraction: 1.8 Gy
Plan Total Fractions Prescribed: 28
Plan Total Prescribed Dose: 50.4 Gy
Reference Point Dosage Given to Date: 30.6 Gy
Reference Point Session Dosage Given: 1.8 Gy
Session Number: 17

## 2022-06-16 ENCOUNTER — Ambulatory Visit
Admission: RE | Admit: 2022-06-16 | Discharge: 2022-06-16 | Disposition: A | Discharge status: Home / Self Care | Payer: Managed Care, Other (non HMO) | Source: Ambulatory Visit | Attending: Radiation Oncology | Admitting: Radiation Oncology

## 2022-06-16 ENCOUNTER — Other Ambulatory Visit: Payer: Self-pay

## 2022-06-16 DIAGNOSIS — Z510 Encounter for antineoplastic radiation therapy: Secondary | ICD-10-CM | POA: Diagnosis not present

## 2022-06-16 LAB — RAD ONC ARIA SESSION SUMMARY
Course Elapsed Days: 23
Plan Fractions Treated to Date: 18
Plan Prescribed Dose Per Fraction: 1.8 Gy
Plan Total Fractions Prescribed: 28
Plan Total Prescribed Dose: 50.4 Gy
Reference Point Dosage Given to Date: 32.4 Gy
Reference Point Session Dosage Given: 1.8 Gy
Session Number: 18

## 2022-06-17 ENCOUNTER — Other Ambulatory Visit: Payer: Self-pay

## 2022-06-17 ENCOUNTER — Ambulatory Visit
Admission: RE | Admit: 2022-06-17 | Discharge: 2022-06-17 | Disposition: A | Discharge status: Home / Self Care | Payer: Managed Care, Other (non HMO) | Source: Ambulatory Visit | Attending: Radiation Oncology | Admitting: Radiation Oncology

## 2022-06-17 DIAGNOSIS — Z510 Encounter for antineoplastic radiation therapy: Secondary | ICD-10-CM | POA: Diagnosis not present

## 2022-06-17 LAB — RAD ONC ARIA SESSION SUMMARY
Course Elapsed Days: 24
Plan Fractions Treated to Date: 19
Plan Prescribed Dose Per Fraction: 1.8 Gy
Plan Total Fractions Prescribed: 28
Plan Total Prescribed Dose: 50.4 Gy
Reference Point Dosage Given to Date: 34.2 Gy
Reference Point Session Dosage Given: 1.8 Gy
Session Number: 19

## 2022-06-21 ENCOUNTER — Ambulatory Visit
Admission: RE | Admit: 2022-06-21 | Discharge: 2022-06-21 | Disposition: A | Discharge status: Home / Self Care | Payer: Managed Care, Other (non HMO) | Source: Ambulatory Visit | Attending: Radiation Oncology | Admitting: Radiation Oncology

## 2022-06-21 ENCOUNTER — Other Ambulatory Visit: Payer: Self-pay

## 2022-06-21 DIAGNOSIS — Z510 Encounter for antineoplastic radiation therapy: Secondary | ICD-10-CM | POA: Diagnosis not present

## 2022-06-21 LAB — RAD ONC ARIA SESSION SUMMARY
Course Elapsed Days: 28
Plan Fractions Treated to Date: 20
Plan Prescribed Dose Per Fraction: 1.8 Gy
Plan Total Fractions Prescribed: 28
Plan Total Prescribed Dose: 50.4 Gy
Reference Point Dosage Given to Date: 36 Gy
Reference Point Session Dosage Given: 1.8 Gy
Session Number: 20

## 2022-06-22 ENCOUNTER — Other Ambulatory Visit: Payer: Self-pay

## 2022-06-22 ENCOUNTER — Inpatient Hospital Stay: Payer: Managed Care, Other (non HMO)

## 2022-06-22 ENCOUNTER — Ambulatory Visit
Admission: RE | Admit: 2022-06-22 | Discharge: 2022-06-22 | Disposition: A | Discharge status: Home / Self Care | Payer: Managed Care, Other (non HMO) | Source: Ambulatory Visit | Attending: Radiation Oncology | Admitting: Radiation Oncology

## 2022-06-22 DIAGNOSIS — C50911 Malignant neoplasm of unspecified site of right female breast: Secondary | ICD-10-CM

## 2022-06-22 DIAGNOSIS — Z510 Encounter for antineoplastic radiation therapy: Secondary | ICD-10-CM | POA: Diagnosis not present

## 2022-06-22 LAB — CBC
HCT: 40.5 % (ref 36.0–46.0)
Hemoglobin: 13.5 g/dL (ref 12.0–15.0)
MCH: 32.7 pg (ref 26.0–34.0)
MCHC: 33.3 g/dL (ref 30.0–36.0)
MCV: 98.1 fL (ref 80.0–100.0)
Platelets: 158 10*3/uL (ref 150–400)
RBC: 4.13 MIL/uL (ref 3.87–5.11)
RDW: 12.6 % (ref 11.5–15.5)
WBC: 6.2 10*3/uL (ref 4.0–10.5)
nRBC: 0 % (ref 0.0–0.2)

## 2022-06-22 LAB — RAD ONC ARIA SESSION SUMMARY
Course Elapsed Days: 29
Plan Fractions Treated to Date: 21
Plan Prescribed Dose Per Fraction: 1.8 Gy
Plan Total Fractions Prescribed: 28
Plan Total Prescribed Dose: 50.4 Gy
Reference Point Dosage Given to Date: 37.8 Gy
Reference Point Session Dosage Given: 1.8 Gy
Session Number: 21

## 2022-06-23 ENCOUNTER — Other Ambulatory Visit: Payer: Self-pay

## 2022-06-23 ENCOUNTER — Ambulatory Visit
Admission: RE | Admit: 2022-06-23 | Discharge: 2022-06-23 | Disposition: A | Discharge status: Home / Self Care | Payer: Managed Care, Other (non HMO) | Source: Ambulatory Visit | Attending: Radiation Oncology | Admitting: Radiation Oncology

## 2022-06-23 DIAGNOSIS — Z510 Encounter for antineoplastic radiation therapy: Secondary | ICD-10-CM | POA: Diagnosis not present

## 2022-06-23 LAB — RAD ONC ARIA SESSION SUMMARY
Course Elapsed Days: 30
Plan Fractions Treated to Date: 22
Plan Prescribed Dose Per Fraction: 1.8 Gy
Plan Total Fractions Prescribed: 28
Plan Total Prescribed Dose: 50.4 Gy
Reference Point Dosage Given to Date: 39.6 Gy
Reference Point Session Dosage Given: 1.8 Gy
Session Number: 22

## 2022-06-24 ENCOUNTER — Ambulatory Visit
Admission: RE | Admit: 2022-06-24 | Discharge: 2022-06-24 | Disposition: A | Discharge status: Home / Self Care | Payer: Managed Care, Other (non HMO) | Source: Ambulatory Visit | Attending: Radiation Oncology | Admitting: Radiation Oncology

## 2022-06-24 ENCOUNTER — Other Ambulatory Visit: Payer: Self-pay

## 2022-06-24 DIAGNOSIS — Z510 Encounter for antineoplastic radiation therapy: Secondary | ICD-10-CM | POA: Diagnosis not present

## 2022-06-24 LAB — RAD ONC ARIA SESSION SUMMARY
Course Elapsed Days: 31
Plan Fractions Treated to Date: 23
Plan Prescribed Dose Per Fraction: 1.8 Gy
Plan Total Fractions Prescribed: 28
Plan Total Prescribed Dose: 50.4 Gy
Reference Point Dosage Given to Date: 41.4 Gy
Reference Point Session Dosage Given: 1.8 Gy
Session Number: 23

## 2022-06-28 ENCOUNTER — Ambulatory Visit: Payer: Managed Care, Other (non HMO)

## 2022-06-29 ENCOUNTER — Ambulatory Visit
Admission: RE | Admit: 2022-06-29 | Discharge: 2022-06-29 | Disposition: A | Discharge status: Home / Self Care | Payer: Managed Care, Other (non HMO) | Source: Ambulatory Visit | Attending: Radiation Oncology | Admitting: Radiation Oncology

## 2022-06-29 ENCOUNTER — Other Ambulatory Visit: Payer: Self-pay

## 2022-06-29 DIAGNOSIS — C773 Secondary and unspecified malignant neoplasm of axilla and upper limb lymph nodes: Secondary | ICD-10-CM | POA: Diagnosis not present

## 2022-06-29 DIAGNOSIS — Z510 Encounter for antineoplastic radiation therapy: Secondary | ICD-10-CM | POA: Insufficient documentation

## 2022-06-29 DIAGNOSIS — Z170 Estrogen receptor positive status [ER+]: Secondary | ICD-10-CM | POA: Diagnosis not present

## 2022-06-29 DIAGNOSIS — C50411 Malignant neoplasm of upper-outer quadrant of right female breast: Secondary | ICD-10-CM | POA: Diagnosis not present

## 2022-06-29 DIAGNOSIS — F17210 Nicotine dependence, cigarettes, uncomplicated: Secondary | ICD-10-CM | POA: Insufficient documentation

## 2022-06-29 LAB — RAD ONC ARIA SESSION SUMMARY
Course Elapsed Days: 36
Plan Fractions Treated to Date: 24
Plan Prescribed Dose Per Fraction: 1.8 Gy
Plan Total Fractions Prescribed: 28
Plan Total Prescribed Dose: 50.4 Gy
Reference Point Dosage Given to Date: 43.2 Gy
Reference Point Session Dosage Given: 1.8 Gy
Session Number: 24

## 2022-06-30 ENCOUNTER — Other Ambulatory Visit: Payer: Self-pay

## 2022-06-30 ENCOUNTER — Ambulatory Visit
Admission: RE | Admit: 2022-06-30 | Discharge: 2022-06-30 | Disposition: A | Discharge status: Home / Self Care | Payer: Managed Care, Other (non HMO) | Source: Ambulatory Visit | Attending: Radiation Oncology | Admitting: Radiation Oncology

## 2022-06-30 DIAGNOSIS — Z510 Encounter for antineoplastic radiation therapy: Secondary | ICD-10-CM | POA: Diagnosis not present

## 2022-06-30 LAB — RAD ONC ARIA SESSION SUMMARY
Course Elapsed Days: 37
Plan Fractions Treated to Date: 25
Plan Prescribed Dose Per Fraction: 1.8 Gy
Plan Total Fractions Prescribed: 28
Plan Total Prescribed Dose: 50.4 Gy
Reference Point Dosage Given to Date: 45 Gy
Reference Point Session Dosage Given: 1.8 Gy
Session Number: 25

## 2022-07-01 ENCOUNTER — Ambulatory Visit
Admission: RE | Admit: 2022-07-01 | Discharge: 2022-07-01 | Disposition: A | Discharge status: Home / Self Care | Payer: Managed Care, Other (non HMO) | Source: Ambulatory Visit | Attending: Radiation Oncology | Admitting: Radiation Oncology

## 2022-07-01 ENCOUNTER — Other Ambulatory Visit: Payer: Self-pay

## 2022-07-01 DIAGNOSIS — Z510 Encounter for antineoplastic radiation therapy: Secondary | ICD-10-CM | POA: Diagnosis not present

## 2022-07-01 LAB — RAD ONC ARIA SESSION SUMMARY
Course Elapsed Days: 38
Plan Fractions Treated to Date: 26
Plan Prescribed Dose Per Fraction: 1.8 Gy
Plan Total Fractions Prescribed: 28
Plan Total Prescribed Dose: 50.4 Gy
Reference Point Dosage Given to Date: 46.8 Gy
Reference Point Session Dosage Given: 1.8 Gy
Session Number: 26

## 2022-07-04 ENCOUNTER — Other Ambulatory Visit: Payer: Self-pay

## 2022-07-04 ENCOUNTER — Ambulatory Visit
Admission: RE | Admit: 2022-07-04 | Discharge: 2022-07-04 | Disposition: A | Discharge status: Home / Self Care | Payer: Managed Care, Other (non HMO) | Source: Ambulatory Visit | Attending: Radiation Oncology | Admitting: Radiation Oncology

## 2022-07-04 DIAGNOSIS — Z510 Encounter for antineoplastic radiation therapy: Secondary | ICD-10-CM | POA: Diagnosis not present

## 2022-07-04 LAB — RAD ONC ARIA SESSION SUMMARY
Course Elapsed Days: 41
Plan Fractions Treated to Date: 27
Plan Prescribed Dose Per Fraction: 1.8 Gy
Plan Total Fractions Prescribed: 28
Plan Total Prescribed Dose: 50.4 Gy
Reference Point Dosage Given to Date: 48.6 Gy
Reference Point Session Dosage Given: 1.8 Gy
Session Number: 27

## 2022-07-05 ENCOUNTER — Ambulatory Visit
Admission: RE | Admit: 2022-07-05 | Discharge: 2022-07-05 | Disposition: A | Discharge status: Home / Self Care | Payer: Managed Care, Other (non HMO) | Source: Ambulatory Visit | Attending: Radiation Oncology | Admitting: Radiation Oncology

## 2022-07-05 ENCOUNTER — Ambulatory Visit: Payer: Managed Care, Other (non HMO)

## 2022-07-05 ENCOUNTER — Other Ambulatory Visit: Payer: Self-pay

## 2022-07-05 DIAGNOSIS — Z510 Encounter for antineoplastic radiation therapy: Secondary | ICD-10-CM | POA: Diagnosis not present

## 2022-07-05 LAB — RAD ONC ARIA SESSION SUMMARY
Course Elapsed Days: 42
Plan Fractions Treated to Date: 28
Plan Prescribed Dose Per Fraction: 1.8 Gy
Plan Total Fractions Prescribed: 28
Plan Total Prescribed Dose: 50.4 Gy
Reference Point Dosage Given to Date: 50.4 Gy
Reference Point Session Dosage Given: 1.8 Gy
Session Number: 28

## 2022-07-06 ENCOUNTER — Ambulatory Visit: Payer: Managed Care, Other (non HMO)

## 2022-07-06 ENCOUNTER — Inpatient Hospital Stay: Payer: Managed Care, Other (non HMO)

## 2022-07-07 ENCOUNTER — Ambulatory Visit
Admission: RE | Admit: 2022-07-07 | Discharge: 2022-07-07 | Disposition: A | Discharge status: Home / Self Care | Payer: Managed Care, Other (non HMO) | Source: Ambulatory Visit | Attending: Radiation Oncology | Admitting: Radiation Oncology

## 2022-07-07 ENCOUNTER — Other Ambulatory Visit: Payer: Self-pay

## 2022-07-07 ENCOUNTER — Ambulatory Visit: Payer: Managed Care, Other (non HMO)

## 2022-07-07 DIAGNOSIS — Z510 Encounter for antineoplastic radiation therapy: Secondary | ICD-10-CM | POA: Diagnosis not present

## 2022-07-07 LAB — RAD ONC ARIA SESSION SUMMARY
Course Elapsed Days: 44
Plan Fractions Treated to Date: 1
Plan Prescribed Dose Per Fraction: 2 Gy
Plan Total Fractions Prescribed: 5
Plan Total Prescribed Dose: 10 Gy
Reference Point Dosage Given to Date: 2 Gy
Reference Point Session Dosage Given: 2 Gy
Session Number: 29

## 2022-07-08 ENCOUNTER — Ambulatory Visit
Admission: RE | Admit: 2022-07-08 | Discharge: 2022-07-08 | Disposition: A | Discharge status: Home / Self Care | Payer: Managed Care, Other (non HMO) | Source: Ambulatory Visit | Attending: Radiation Oncology | Admitting: Radiation Oncology

## 2022-07-08 ENCOUNTER — Other Ambulatory Visit: Payer: Self-pay

## 2022-07-08 DIAGNOSIS — Z510 Encounter for antineoplastic radiation therapy: Secondary | ICD-10-CM | POA: Diagnosis not present

## 2022-07-08 LAB — RAD ONC ARIA SESSION SUMMARY
Course Elapsed Days: 45
Plan Fractions Treated to Date: 2
Plan Prescribed Dose Per Fraction: 2 Gy
Plan Total Fractions Prescribed: 5
Plan Total Prescribed Dose: 10 Gy
Reference Point Dosage Given to Date: 4 Gy
Reference Point Session Dosage Given: 2 Gy
Session Number: 30

## 2022-07-11 ENCOUNTER — Other Ambulatory Visit: Payer: Self-pay

## 2022-07-11 ENCOUNTER — Ambulatory Visit
Admission: RE | Admit: 2022-07-11 | Discharge: 2022-07-11 | Disposition: A | Discharge status: Home / Self Care | Payer: Managed Care, Other (non HMO) | Source: Ambulatory Visit | Attending: Radiation Oncology | Admitting: Radiation Oncology

## 2022-07-11 ENCOUNTER — Ambulatory Visit: Payer: Managed Care, Other (non HMO)

## 2022-07-11 DIAGNOSIS — Z510 Encounter for antineoplastic radiation therapy: Secondary | ICD-10-CM | POA: Diagnosis not present

## 2022-07-11 LAB — RAD ONC ARIA SESSION SUMMARY
Course Elapsed Days: 48
Plan Fractions Treated to Date: 3
Plan Prescribed Dose Per Fraction: 2 Gy
Plan Total Fractions Prescribed: 5
Plan Total Prescribed Dose: 10 Gy
Reference Point Dosage Given to Date: 6 Gy
Reference Point Session Dosage Given: 2 Gy
Session Number: 31

## 2022-07-12 ENCOUNTER — Ambulatory Visit: Payer: Managed Care, Other (non HMO)

## 2022-07-12 ENCOUNTER — Other Ambulatory Visit: Payer: Self-pay

## 2022-07-12 ENCOUNTER — Ambulatory Visit
Admission: RE | Admit: 2022-07-12 | Discharge: 2022-07-12 | Disposition: A | Discharge status: Home / Self Care | Payer: Managed Care, Other (non HMO) | Source: Ambulatory Visit | Attending: Radiation Oncology | Admitting: Radiation Oncology

## 2022-07-12 DIAGNOSIS — Z510 Encounter for antineoplastic radiation therapy: Secondary | ICD-10-CM | POA: Diagnosis not present

## 2022-07-12 LAB — RAD ONC ARIA SESSION SUMMARY
Course Elapsed Days: 49
Plan Fractions Treated to Date: 4
Plan Prescribed Dose Per Fraction: 2 Gy
Plan Total Fractions Prescribed: 5
Plan Total Prescribed Dose: 10 Gy
Reference Point Dosage Given to Date: 8 Gy
Reference Point Session Dosage Given: 2 Gy
Session Number: 32

## 2022-07-13 ENCOUNTER — Ambulatory Visit
Admission: RE | Admit: 2022-07-13 | Discharge: 2022-07-13 | Disposition: A | Discharge status: Home / Self Care | Payer: Managed Care, Other (non HMO) | Source: Ambulatory Visit | Attending: Radiation Oncology | Admitting: Radiation Oncology

## 2022-07-13 ENCOUNTER — Encounter: Payer: Self-pay | Admitting: *Deleted

## 2022-07-13 ENCOUNTER — Other Ambulatory Visit: Payer: Self-pay

## 2022-07-13 DIAGNOSIS — Z510 Encounter for antineoplastic radiation therapy: Secondary | ICD-10-CM | POA: Diagnosis not present

## 2022-07-13 LAB — RAD ONC ARIA SESSION SUMMARY
Course Elapsed Days: 50
Plan Fractions Treated to Date: 5
Plan Prescribed Dose Per Fraction: 2 Gy
Plan Total Fractions Prescribed: 5
Plan Total Prescribed Dose: 10 Gy
Reference Point Dosage Given to Date: 10 Gy
Reference Point Session Dosage Given: 2 Gy
Session Number: 33

## 2022-08-22 ENCOUNTER — Ambulatory Visit: Payer: Managed Care, Other (non HMO) | Admitting: Radiation Oncology

## 2022-08-23 ENCOUNTER — Encounter: Payer: Self-pay | Admitting: Radiation Oncology

## 2022-08-23 ENCOUNTER — Ambulatory Visit
Admission: RE | Admit: 2022-08-23 | Discharge: 2022-08-23 | Disposition: A | Discharge status: Home / Self Care | Payer: Managed Care, Other (non HMO) | Source: Ambulatory Visit | Attending: Radiation Oncology | Admitting: Radiation Oncology

## 2022-08-23 DIAGNOSIS — C773 Secondary and unspecified malignant neoplasm of axilla and upper limb lymph nodes: Secondary | ICD-10-CM | POA: Diagnosis present

## 2022-08-23 DIAGNOSIS — C50911 Malignant neoplasm of unspecified site of right female breast: Secondary | ICD-10-CM

## 2022-08-23 NOTE — Progress Notes (Signed)
Radiation Oncology Follow up Note  Name: Kristin Villa   Date:   08/23/2022 MRN:  [id number] DOB: 17-Aug-1960    This 62 y.o. female presents to the clinic today for 1 month follow-up status post radiation therapy to her right chest wall and patient status post mastectomy for stage Ib (T1 cN1 M0) ER/PR positive HER2 negative invasive lobular carcinoma now out 1 month from postmastectomy radiation.  She is doing well and is without complaint.  Specifically denies any chest wall pain new nodularity cough or bone pain.Marland Kitchen  REFERRING PROVIDER: Wanita Chamberlain, PA-C  HPI: As above.  COMPLICATIONS OF TREATMENT: none  FOLLOW UP COMPLIANCE: keeps appointments   PHYSICAL EXAM:  There were no vitals taken for this visit. Patient is status post bilateral mastectomies.  Chest walls are clear without evidence of mass or nodularity.  No axillary or supraclavicular adenopathy is appreciated.  No lymphedema is noted.  Well-developed well-nourished patient in NAD. HEENT reveals PERLA, EOMI, discs not visualized.  Oral cavity is clear. No oral mucosal lesions are identified. Neck is clear without evidence of cervical or supraclavicular adenopathy. Lungs are clear to A&P. Cardiac examination is essentially unremarkable with regular rate and rhythm without murmur rub or thrill. Abdomen is benign with no organomegaly or masses noted. Motor sensory and DTR levels are equal and symmetric in the upper and lower extremities. Cranial nerves II through XII are grossly intact. Proprioception is intact. No peripheral adenopathy or edema is identified. No motor or sensory levels are noted. Crude visual fields are within normal range.  RADIOLOGY RESULTS: No current films for review  PLAN: Present time patient is doing well 1 month out from chest wall radiation.  She would like to continue her follow-up care in [place] and I am fine with that.  I will forward any of her medical records as needed.  Patient is to call with any  concerns at any time.  I would like to take this opportunity to thank you for allowing me to participate in the care of your patient.Noreene Filbert, MD

## 2023-02-03 IMAGING — MG MM BREAST BX W/ LOC DEV 1ST LESION IMAGE BX SPEC STEREO GUIDE*R*
8 of 16 series · 8 of 24 positions shown · non-contrast
Comparison: Previous exams.
COMPARISON: Previous exams.

Addendum:
CLINICAL DATA: Patient with indeterminate calcifications within the
upper central RIGHT breast presents today for stereotactic biopsy.

Recent diagnosis of invasive mammary carcinoma in the RIGHT breast
at the 11 o'clock axis, and metastatic lymph node in the RIGHT
axilla, by ultrasound-guided biopsies performed on 05/12/2021.
EXAM:
RIGHT BREAST STEREOTACTIC CORE NEEDLE BIOPSY

[R (1 of 8)]
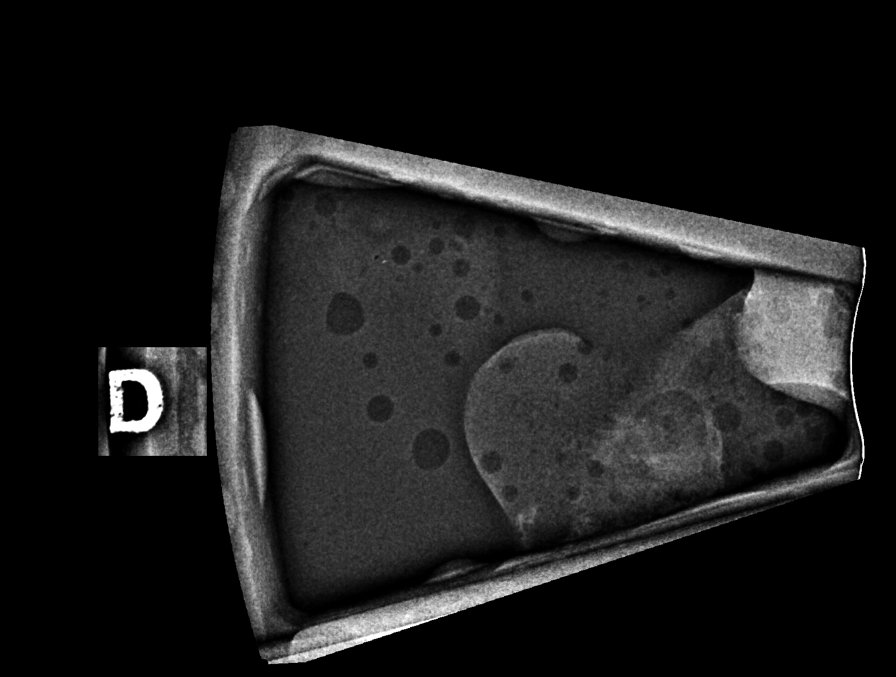

[R (2 of 8)]
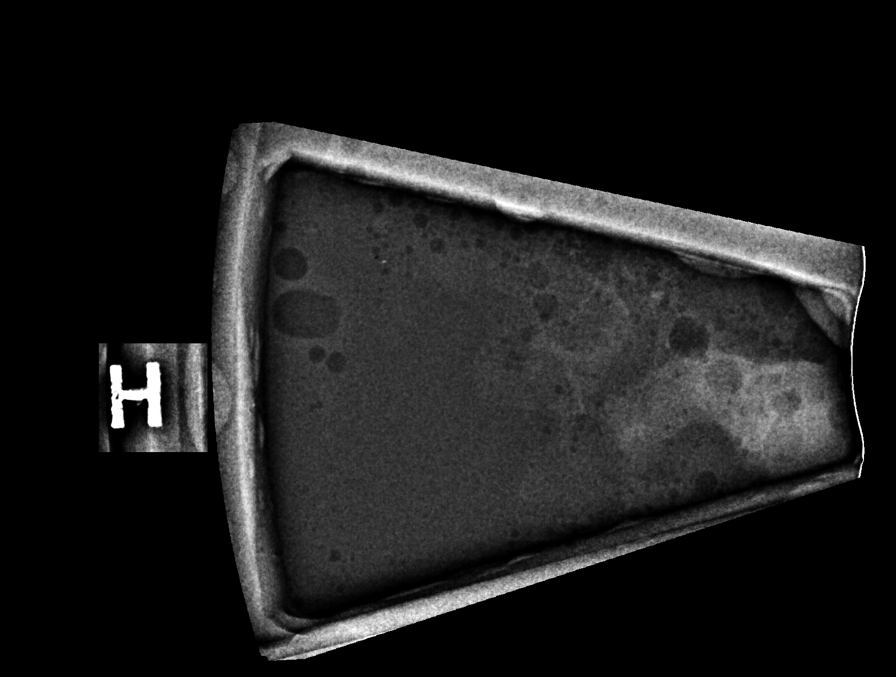

[R (3 of 8)]
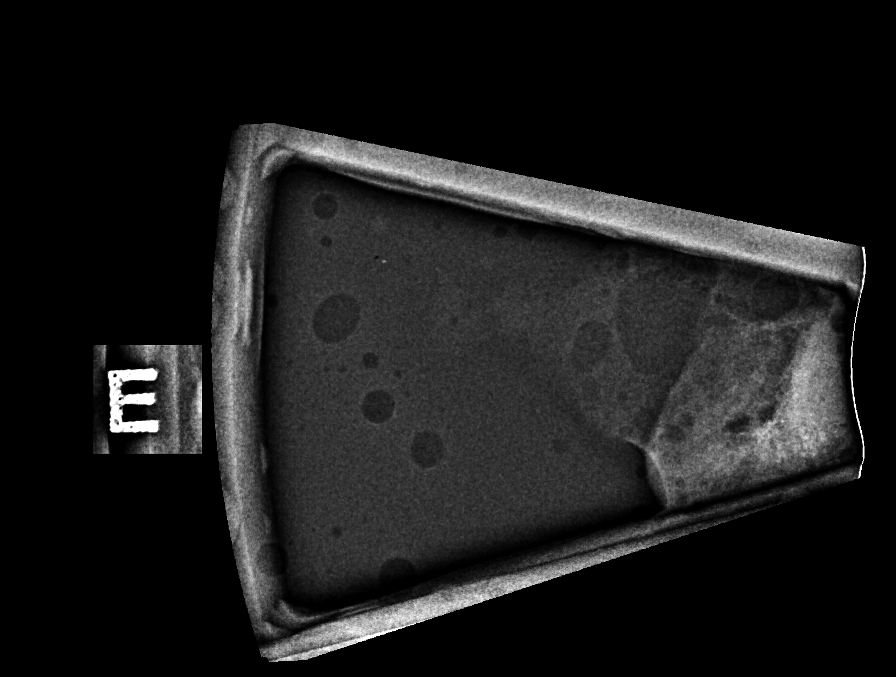

[R (4 of 8)]
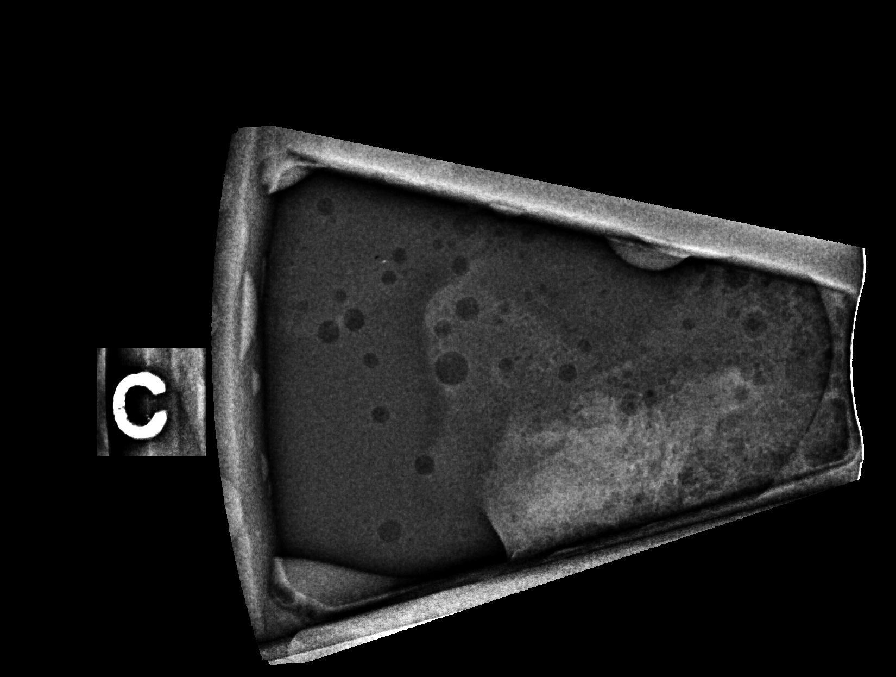

[R (5 of 8)]
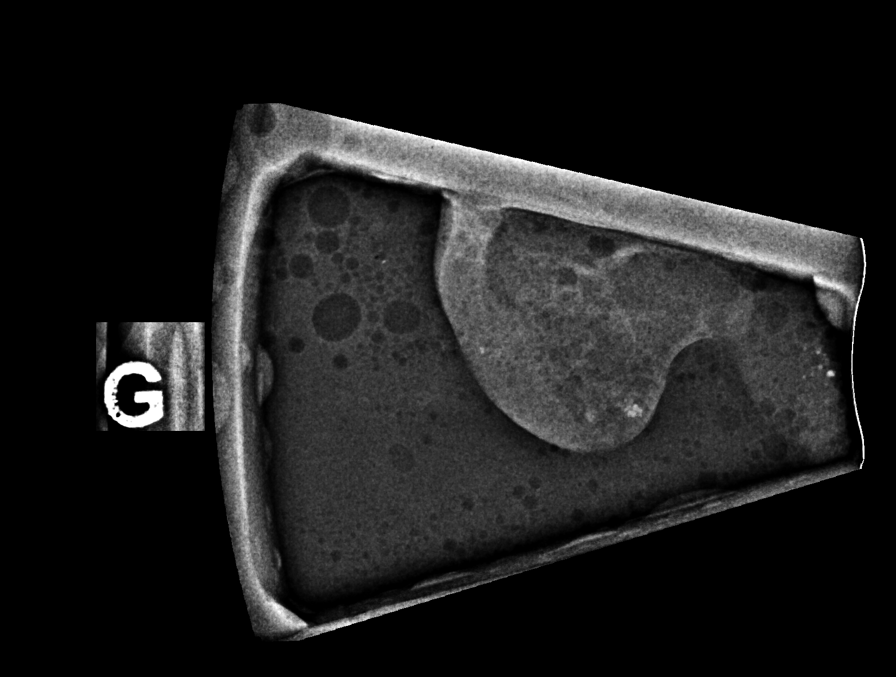

[R (6 of 8)]
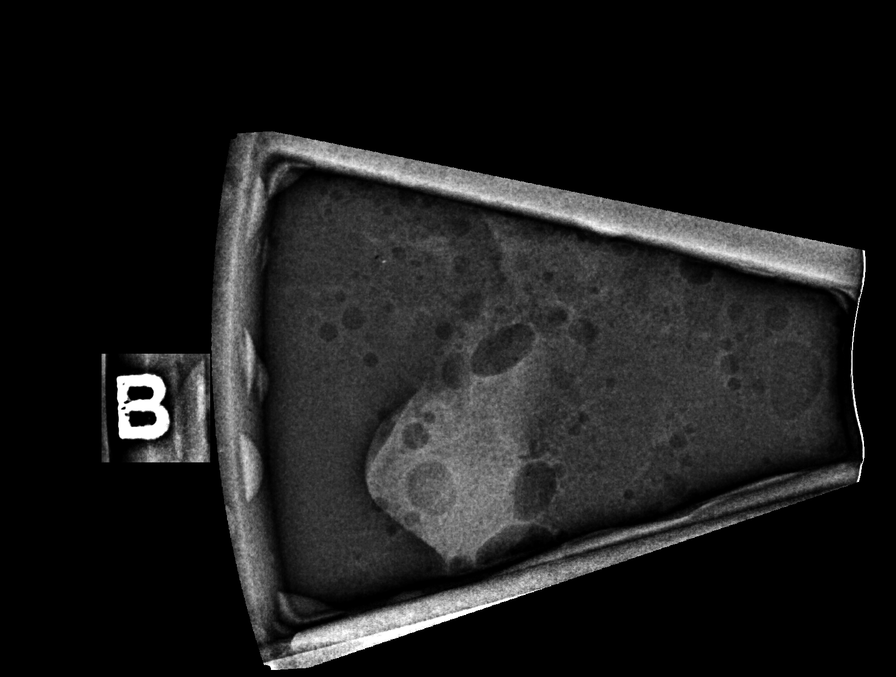

[R (7 of 8)]
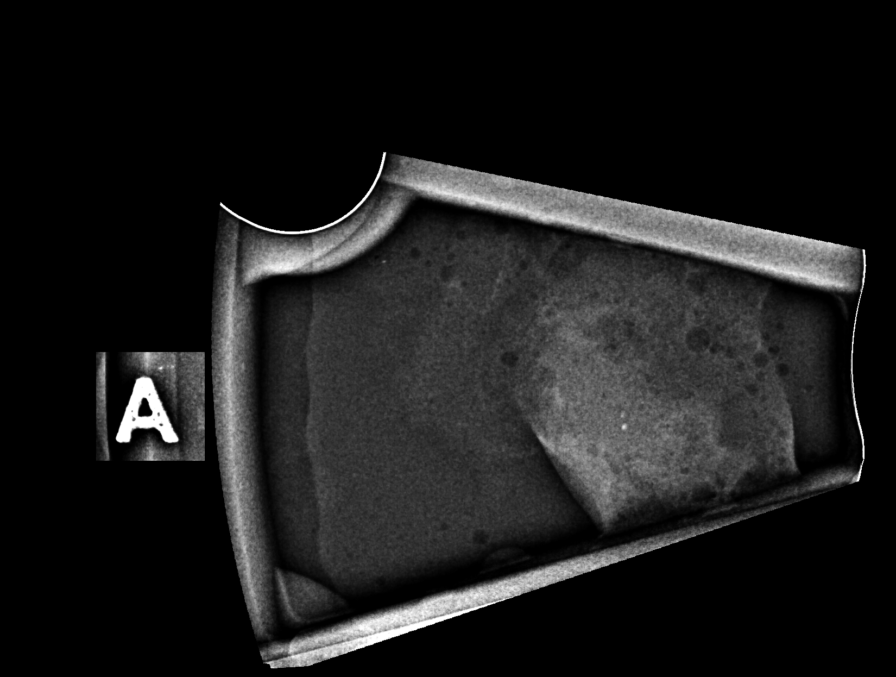

[R (8 of 8)]
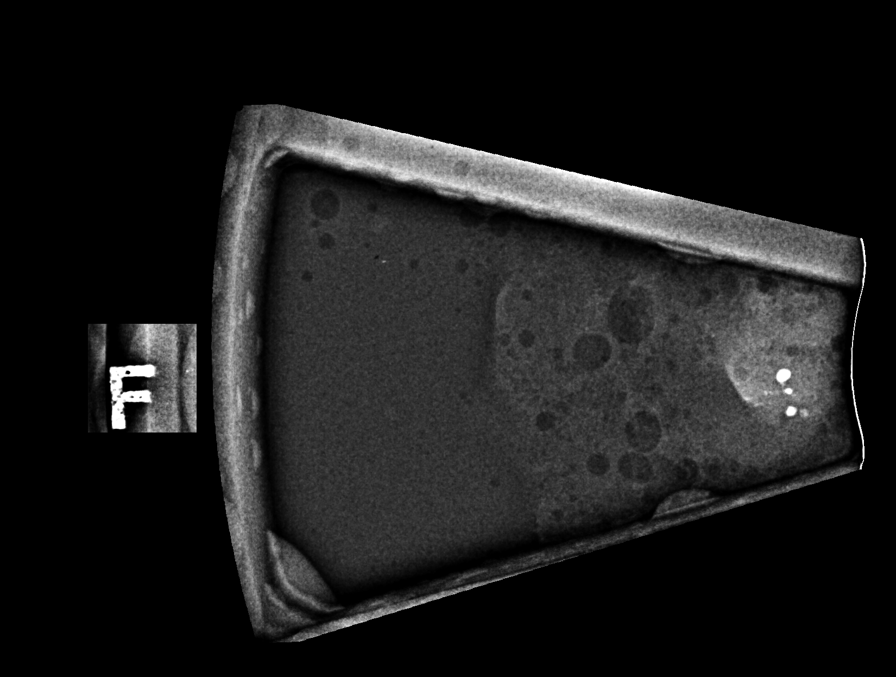

[8 of 24 positions shown; findings below may reference images not displayed]



Using sterile technique and 1% Lidocaine as local anesthetic, under
stereotactic guidance, a 9 gauge vacuum assisted device was used to
perform core needle biopsy of calcifications in the upper central
RIGHT breast using a superior approach. Specimen radiograph was
performed showing calcifications. Specimens with calcifications are
identified for pathology.

Lesion quadrant: Upper central

At the conclusion of the procedure, X shaped tissue marker clip was
deployed into the biopsy cavity. Follow-up 2-view mammogram was
performed and dictated separately.
IMPRESSION: Stereotactic-guided biopsy of indeterminate calcifications within
the upper central RIGHT breast. No apparent complications.

ADDENDUM:
Pathology revealed FIBROCYSTIC CHANGES WITH USUAL DUCTAL
HYPERPLASIA, COLUMNAR CELL CHANGE AND CALCIFICATIONS, SCLEROSING
ADENOSIS WITH CALCIFICATIONS of the RIGHT breast, upper central, (x
clip). This was found to be concordant by Dr. Lazaridis Stermann.

Pathology results were discussed with the patient by telephone. The
patient reported doing well after the biopsy with tenderness at the
site. Post biopsy instructions and care were reviewed and questions
were answered. The patient was encouraged to call The [REDACTED]

The patient has a recent diagnosis of RIGHT breast cancer and
surgical consultation has been arranged with Dr. Brijida Suni at

Pathology results reported by Anneliese Shim, RN on 06/24/2021.



Using sterile technique and 1% Lidocaine as local anesthetic, under
stereotactic guidance, a 9 gauge vacuum assisted device was used to
perform core needle biopsy of calcifications in the upper central
RIGHT breast using a superior approach. Specimen radiograph was
performed showing calcifications. Specimens with calcifications are
identified for pathology.

Lesion quadrant: Upper central

At the conclusion of the procedure, X shaped tissue marker clip was
deployed into the biopsy cavity. Follow-up 2-view mammogram was
performed and dictated separately.
IMPRESSION: Stereotactic-guided biopsy of indeterminate calcifications within
the upper central RIGHT breast. No apparent complications.

## 2023-02-03 IMAGING — MG MM BREAST LOCALIZATION CLIP
4 series · 4 of 12 positions shown · non-contrast
Comparison: Previous exam(s).

CLINICAL DATA: Status post stereotactic biopsy for indeterminate
calcifications within the upper central RIGHT breast.

EXAM:
3D DIAGNOSTIC RIGHT MAMMOGRAM POST STEREOTACTIC BIOPSY

[R ML synth-2D]
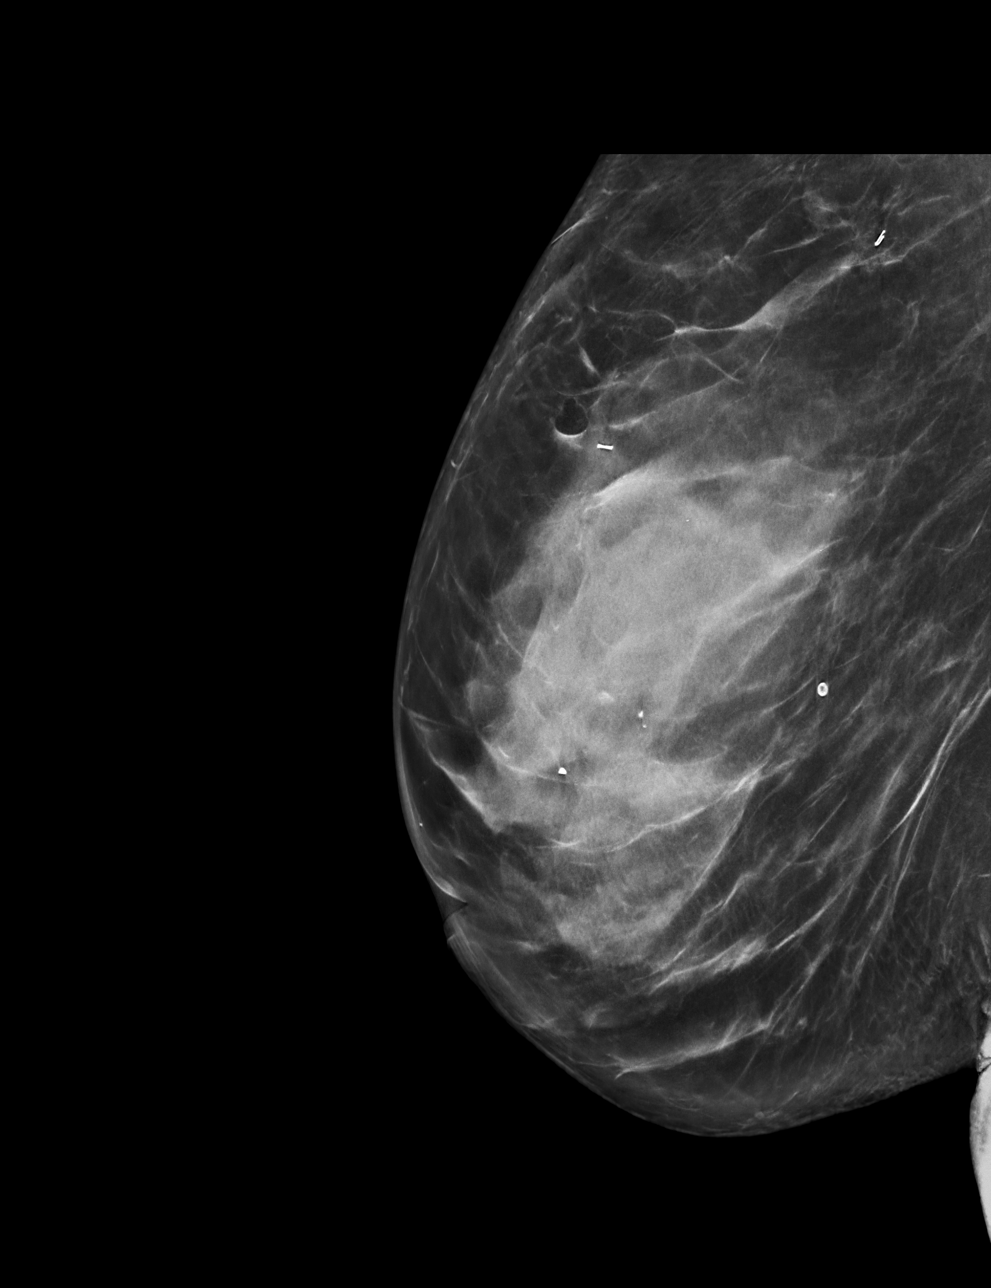

[R CC synth-2D]
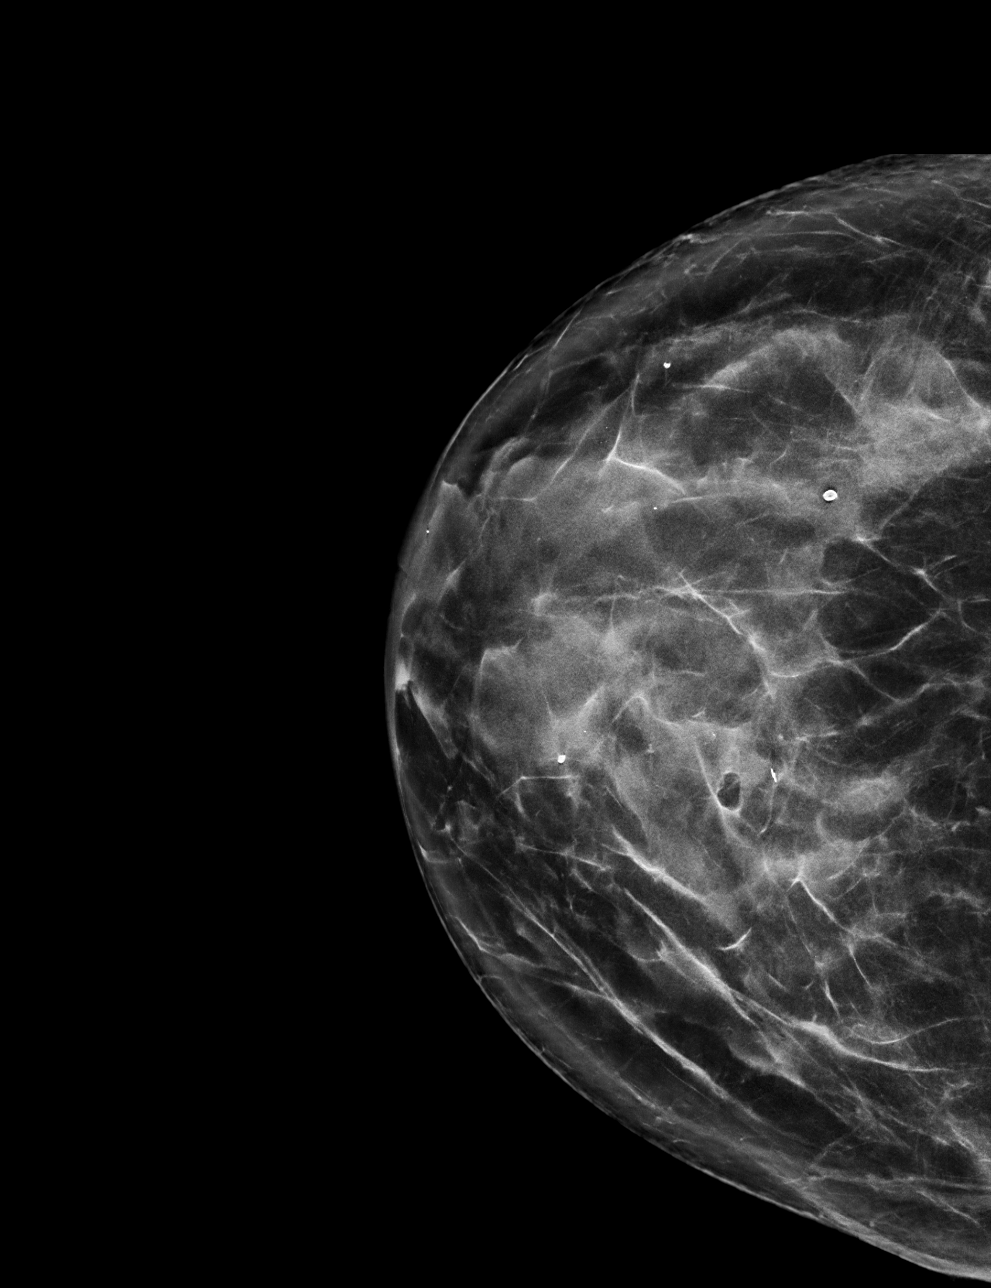

[R CC tomo · tomo slice 35/69.0]
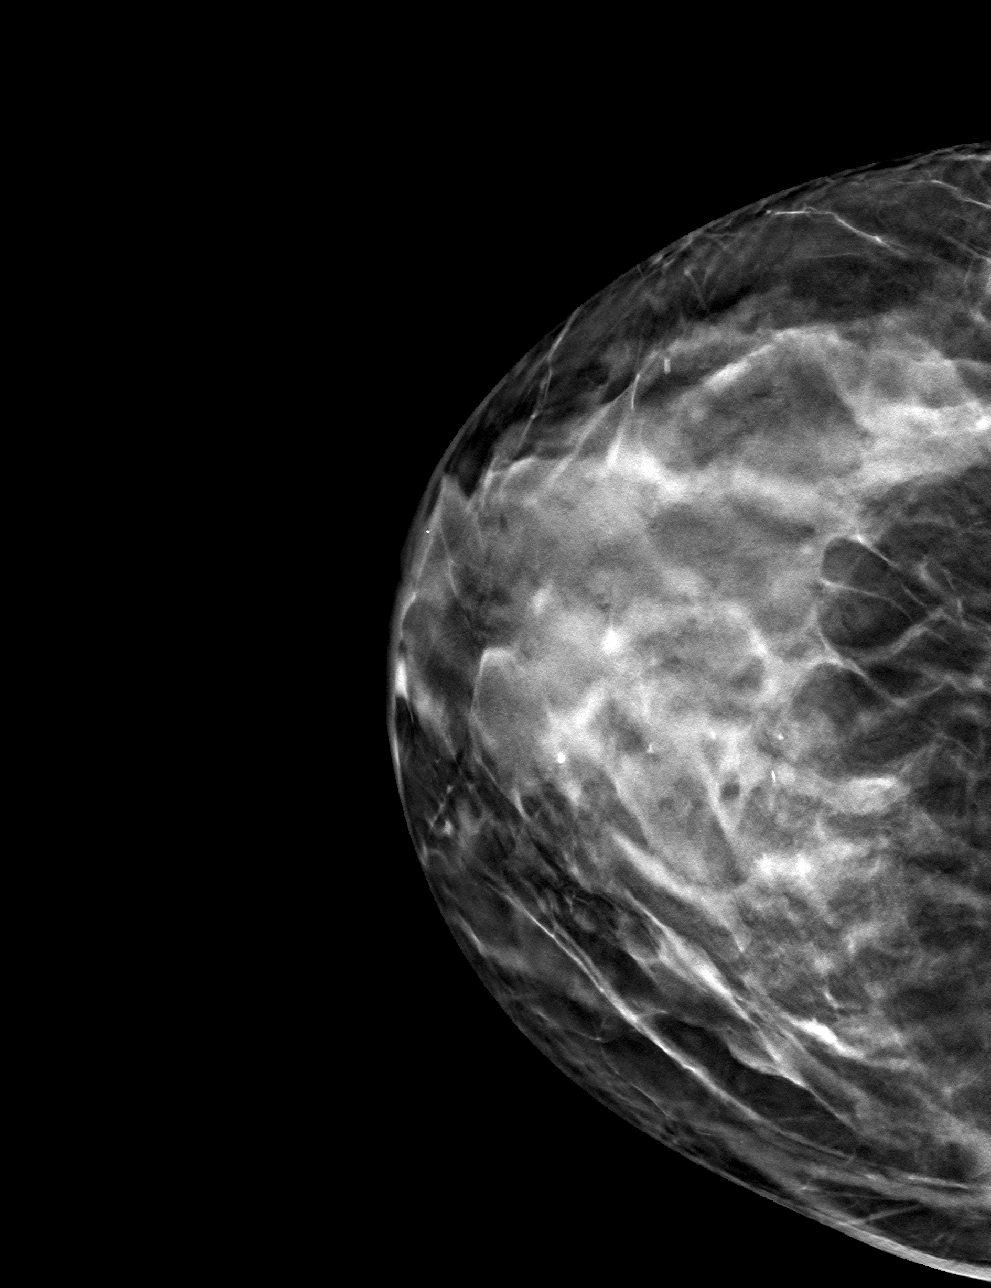

[R ML tomo · tomo slice 41/81.0]
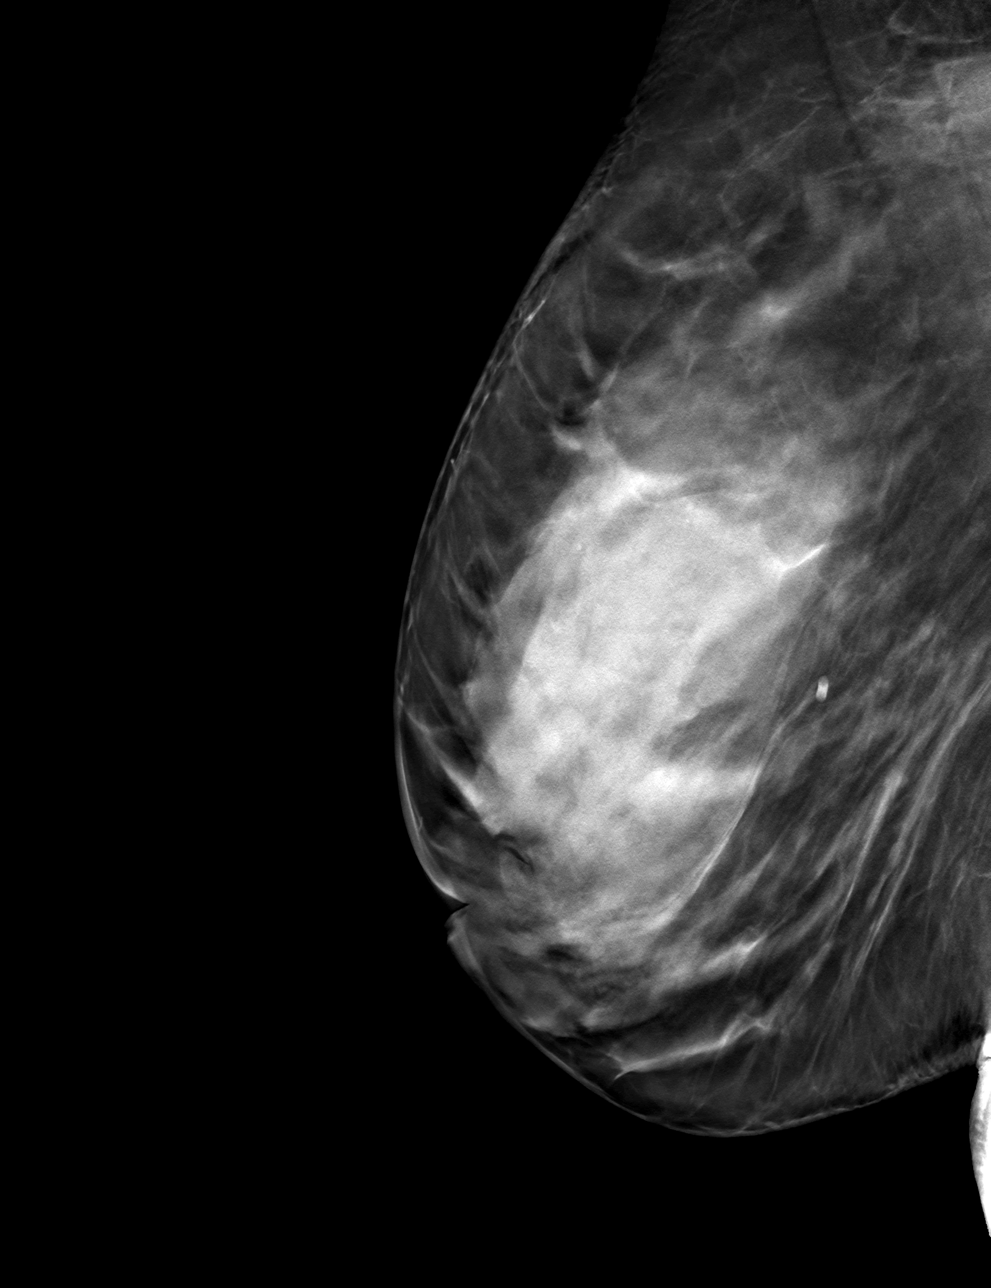

[4 of 12 positions shown; findings below may reference images not displayed]

FINDINGS: 3D Mammographic images were obtained following stereotactic guided
biopsy of indeterminate calcifications within the upper central
RIGHT breast. The biopsy marking clip is in expected position at the
site of biopsy.
IMPRESSION: Appropriate positioning of the X shaped biopsy marking clip at the
site of biopsy in the upper central RIGHT breast.

Final Assessment: Post Procedure Mammograms for Marker Placement

## 2024-03-25 ENCOUNTER — Encounter (HOSPITAL_COMMUNITY): Payer: Self-pay

## 2024-03-25 ENCOUNTER — Emergency Department (HOSPITAL_COMMUNITY)
Admission: EM | Admit: 2024-03-25 | Discharge: 2024-03-26 | Disposition: A | Discharge status: Home / Self Care | Attending: Emergency Medicine | Admitting: Emergency Medicine

## 2024-03-25 ENCOUNTER — Other Ambulatory Visit: Payer: Self-pay

## 2024-03-25 ENCOUNTER — Emergency Department (HOSPITAL_COMMUNITY)

## 2024-03-25 DIAGNOSIS — Z85828 Personal history of other malignant neoplasm of skin: Secondary | ICD-10-CM | POA: Diagnosis not present

## 2024-03-25 DIAGNOSIS — K56609 Unspecified intestinal obstruction, unspecified as to partial versus complete obstruction: Secondary | ICD-10-CM | POA: Diagnosis not present

## 2024-03-25 DIAGNOSIS — Z85528 Personal history of other malignant neoplasm of kidney: Secondary | ICD-10-CM | POA: Insufficient documentation

## 2024-03-25 DIAGNOSIS — Z853 Personal history of malignant neoplasm of breast: Secondary | ICD-10-CM | POA: Insufficient documentation

## 2024-03-25 DIAGNOSIS — R1010 Upper abdominal pain, unspecified: Secondary | ICD-10-CM | POA: Diagnosis present

## 2024-03-25 LAB — COMPREHENSIVE METABOLIC PANEL WITH GFR
ALT: 14 U/L (ref 0–44)
AST: 22 U/L (ref 15–41)
Albumin: 4 g/dL (ref 3.5–5.0)
Alkaline Phosphatase: 68 U/L (ref 38–126)
Anion gap: 14 (ref 5–15)
BUN: 16 mg/dL (ref 8–23)
CO2: 24 mmol/L (ref 22–32)
Calcium: 9.5 mg/dL (ref 8.9–10.3)
Chloride: 101 mmol/L (ref 98–111)
Creatinine, Ser: 0.9 mg/dL (ref 0.44–1.00)
GFR, Estimated: >60 mL/min (ref >60)
Glucose, Bld: 96 mg/dL (ref 70–99)
Potassium: 3.9 mmol/L (ref 3.5–5.1)
Sodium: 139 mmol/L (ref 135–145)
Total Bilirubin: 1.1 mg/dL (ref 0.0–1.2)
Total Protein: 6.5 g/dL (ref 6.5–8.1)

## 2024-03-25 LAB — CBC
HCT: 45.2 % (ref 36.0–46.0)
Hemoglobin: 14.9 g/dL (ref 12.0–15.0)
MCH: 32.7 pg (ref 26.0–34.0)
MCHC: 33 g/dL (ref 30.0–36.0)
MCV: 99.1 fL (ref 80.0–100.0)
Platelets: 234 K/uL (ref 150–400)
RBC: 4.56 MIL/uL (ref 3.87–5.11)
RDW: 13.2 % (ref 11.5–15.5)
WBC: 12.5 K/uL — ABNORMAL HIGH (ref 4.0–10.5)
nRBC: 0 % (ref 0.0–0.2)

## 2024-03-25 MED ORDER — IOHEXOL 300 MG/ML  SOLN
100.0000 mL | Freq: Once | INTRAMUSCULAR | Status: AC | PRN
  Administered 2024-03-26: 100 mL via INTRAVENOUS

## 2024-03-25 NOTE — ED Provider Notes (Signed)
 [place] EMERGENCY DEPARTMENT AT [facility] Provider Note   CSN: [id number] Arrival date & time: 03/25/24  8077     Patient presents with: Abdominal Pain   Kristin Villa is a 63 y.o. female.  {Add pertinent medical, surgical, social history, OB history to HPI:32947} HPI     This is a 63 year old female with a history of breast cancer, squamous cell carcinoma of the face, renal cell carcinoma who presents with abdominal pain.  Patient reports upper abdominal pain that radiates downwards.  Onset of symptoms on Friday 8 3.  She states that prior to that she ate a normal lunch.  She has not had any nausea or vomiting.  Last bowel movement was Saturday morning and she reports that as normal.  Has not taken anything for the pain.  Prior to Admission medications   Medication Sig Start Date End Date Taking? Authorizing Provider  acetaminophen  (TYLENOL ) 650 MG CR tablet Take 650 mg by mouth every 8 (eight) hours as needed for pain.    [provider]  CHOLECALCIFEROL 25 MCG (1000 UNIT) tablet Take 1,000 Units by mouth daily.    [provider]  cyclobenzaprine  (FLEXERIL ) 10 MG tablet Take 5 mg by mouth 3 (three) times daily as needed for muscle spasms.    [provider]  HYDROcodone -acetaminophen  (NORCO) 7.5-325 MG tablet Take 1 tablet by mouth every 4 (four) hours as needed for moderate pain. Patient not taking: Reported on 09/07/2021 09/02/21   Mavis Anes, MD  metoprolol  succinate (TOPROL -XL) 25 MG 24 hr tablet Take 25 mg by mouth daily. 06/21/21   [provider]  rosuvastatin  (CRESTOR ) 5 MG tablet Take 5 mg by mouth daily.    [provider]    Allergies: Gadolinium derivatives    Review of Systems  Constitutional:  Negative for fever.  Respiratory:  Negative for shortness of breath.   Cardiovascular:  Negative for chest pain.  Gastrointestinal:  Positive for abdominal distention. Negative for nausea and vomiting.  All  other systems reviewed and are negative.   Updated Vital Signs BP 138/63 (BP Location: Right Arm)   Pulse 98   Temp 99.1 F (37.3 C) (Oral)   Resp 16   Ht 1.702 m (5' 7)   Wt 66.2 kg   SpO2 100%   BMI 22.86 kg/m   Physical Exam Vitals and nursing note reviewed.  Constitutional:      Appearance: She is well-developed.  HENT:     Head: Normocephalic and atraumatic.  Eyes:     Pupils: Pupils are equal, round, and reactive to light.  Cardiovascular:     Rate and Rhythm: Normal rate and regular rhythm.     Heart sounds: Normal heart sounds.  Pulmonary:     Effort: Pulmonary effort is normal. No respiratory distress.     Breath sounds: No wheezing.  Abdominal:     General: Bowel sounds are normal.     Palpations: Abdomen is soft.     Tenderness: There is abdominal tenderness in the epigastric area. There is no guarding or rebound.  Musculoskeletal:     Cervical back: Neck supple.  Skin:    General: Skin is warm and dry.  Neurological:     General: No focal deficit present.     Mental Status: She is alert and oriented to person, place, and time.     (all labs ordered are listed, but only abnormal results are displayed) Labs Reviewed  CBC - Abnormal; Notable for  the following components:      Result Value   WBC 12.5 (*)    All other components within normal limits  COMPREHENSIVE METABOLIC PANEL WITH GFR  LIPASE, BLOOD  URINALYSIS, ROUTINE W REFLEX MICROSCOPIC    EKG: None  Radiology: No results found.  {Document cardiac monitor, telemetry assessment procedure when appropriate:32947} Procedures   Medications Ordered in the ED - No data to display    {Click here for ABCD2, HEART and other calculators REFRESH Note before signing:1}                              Medical Decision Making Amount and/or Complexity of Data Reviewed Labs: ordered. Radiology: ordered.   ***  {Document critical care time when appropriate  Document review of labs and clinical  decision tools ie CHADS2VASC2, etc  Document your independent review of radiology images and any outside records  Document your discussion with family members, caretakers and with consultants  Document social determinants of health affecting pt's care  Document your decision making why or why not admission, treatments were needed:32947:::1}   Final diagnoses:  None    ED Discharge Orders     None

## 2024-03-25 NOTE — ED Triage Notes (Signed)
 Pt to ED with c/o abd pain that started Friday, pain comes in waves, generalized abd pain. Pt has not had BM since Saturday, which was loose stool.

## 2024-03-26 LAB — LIPASE, BLOOD: Lipase: 25 U/L (ref 11–51)

## 2024-03-26 MED ORDER — SODIUM CHLORIDE 0.9 % IV BOLUS: 1000.0000 mL | Freq: Once | INTRAVENOUS | Status: DC

## 2024-03-26 MED ORDER — ONDANSETRON HCL 4 MG/2ML IJ SOLN
4.0000 mg | Freq: Once | INTRAMUSCULAR | Status: DC
  Filled 2024-03-26: qty 2

## 2024-03-26 MED ORDER — ONDANSETRON 4 MG PO TBDP: 4.0000 mg | ORAL_TABLET | Freq: Three times a day (TID) | ORAL | 0 refills | Status: AC | PRN

## 2024-03-26 NOTE — ED Notes (Signed)
 Pt had conversation with MD about being admitted for possible bowel obstruction and patient refused to stay. Will follow up with her doctors. [place] that she will come back if she gets worse.

## 2024-03-26 NOTE — Discharge Instructions (Signed)
 You were seen today for abdominal pain and not eating.  Your CT imaging indicates potentially an early partial small bowel obstruction.  You sometimes can have bowels that do not want to move quickly either.  Often times people require hospitalization for this.  You were offered hospitalization but declined.  Will discharge with some Zofran .  If abdominal pain worsens, you develop significant nausea or vomiting, you should be reevaluated immediately.  Stick to a clear liquid diet.

## 2024-05-13 ENCOUNTER — Encounter (INDEPENDENT_AMBULATORY_CARE_PROVIDER_SITE_OTHER): Payer: Self-pay | Admitting: *Deleted

## 2024-05-29 ENCOUNTER — Other Ambulatory Visit (HOSPITAL_COMMUNITY): Payer: Self-pay | Admitting: Gastroenterology

## 2024-05-29 DIAGNOSIS — R1084 Generalized abdominal pain: Secondary | ICD-10-CM

## 2024-05-31 ENCOUNTER — Encounter (HOSPITAL_COMMUNITY): Payer: Self-pay | Admitting: Radiology

## 2024-05-31 ENCOUNTER — Ambulatory Visit (HOSPITAL_COMMUNITY)
Admission: RE | Admit: 2024-05-31 | Discharge: 2024-05-31 | Payer: Self-pay | Attending: Gastroenterology | Admitting: Gastroenterology

## 2024-05-31 DIAGNOSIS — R1084 Generalized abdominal pain: Secondary | ICD-10-CM

## 2024-05-31 MED ORDER — IOHEXOL 300 MG/ML  SOLN
100.0000 mL | Freq: Once | INTRAMUSCULAR | Status: AC | PRN
  Administered 2024-05-31: 100 mL via INTRAVENOUS

## 2024-06-01 ENCOUNTER — Other Ambulatory Visit: Payer: Self-pay

## 2024-06-01 ENCOUNTER — Inpatient Hospital Stay (HOSPITAL_COMMUNITY)

## 2024-06-01 ENCOUNTER — Inpatient Hospital Stay (HOSPITAL_COMMUNITY)
Admission: EM | Admit: 2024-06-01 | Discharge: 2024-06-04 | DRG: 390 | Disposition: A | Attending: Family Medicine | Admitting: Family Medicine

## 2024-06-01 ENCOUNTER — Encounter (HOSPITAL_COMMUNITY): Payer: Self-pay | Admitting: Family Medicine

## 2024-06-01 DIAGNOSIS — Z961 Presence of intraocular lens: Secondary | ICD-10-CM | POA: Diagnosis present

## 2024-06-01 DIAGNOSIS — I10 Essential (primary) hypertension: Secondary | ICD-10-CM | POA: Diagnosis present

## 2024-06-01 DIAGNOSIS — Z9013 Acquired absence of bilateral breasts and nipples: Secondary | ICD-10-CM | POA: Diagnosis not present

## 2024-06-01 DIAGNOSIS — Z9221 Personal history of antineoplastic chemotherapy: Secondary | ICD-10-CM | POA: Diagnosis not present

## 2024-06-01 DIAGNOSIS — Z905 Acquired absence of kidney: Secondary | ICD-10-CM | POA: Diagnosis not present

## 2024-06-01 DIAGNOSIS — K9049 Malabsorption due to intolerance, not elsewhere classified: Secondary | ICD-10-CM | POA: Diagnosis present

## 2024-06-01 DIAGNOSIS — Z91041 Radiographic dye allergy status: Secondary | ICD-10-CM | POA: Diagnosis not present

## 2024-06-01 DIAGNOSIS — R1084 Generalized abdominal pain: Secondary | ICD-10-CM | POA: Diagnosis present

## 2024-06-01 DIAGNOSIS — K56609 Unspecified intestinal obstruction, unspecified as to partial versus complete obstruction: Principal | ICD-10-CM | POA: Diagnosis present

## 2024-06-01 DIAGNOSIS — E875 Hyperkalemia: Secondary | ICD-10-CM | POA: Diagnosis present

## 2024-06-01 DIAGNOSIS — Z79899 Other long term (current) drug therapy: Secondary | ICD-10-CM | POA: Diagnosis not present

## 2024-06-01 DIAGNOSIS — E785 Hyperlipidemia, unspecified: Secondary | ICD-10-CM | POA: Diagnosis present

## 2024-06-01 DIAGNOSIS — Z90711 Acquired absence of uterus with remaining cervical stump: Secondary | ICD-10-CM | POA: Diagnosis not present

## 2024-06-01 DIAGNOSIS — F17210 Nicotine dependence, cigarettes, uncomplicated: Secondary | ICD-10-CM | POA: Diagnosis present

## 2024-06-01 DIAGNOSIS — Z85528 Personal history of other malignant neoplasm of kidney: Secondary | ICD-10-CM | POA: Diagnosis not present

## 2024-06-01 DIAGNOSIS — K56600 Partial intestinal obstruction, unspecified as to cause: Secondary | ICD-10-CM | POA: Diagnosis present

## 2024-06-01 DIAGNOSIS — Z85820 Personal history of malignant melanoma of skin: Secondary | ICD-10-CM | POA: Diagnosis not present

## 2024-06-01 DIAGNOSIS — Z853 Personal history of malignant neoplasm of breast: Secondary | ICD-10-CM | POA: Diagnosis not present

## 2024-06-01 LAB — URINALYSIS, ROUTINE W REFLEX MICROSCOPIC
Bilirubin Urine: NEGATIVE
Glucose, UA: NEGATIVE mg/dL
Hgb urine dipstick: NEGATIVE
Ketones, ur: 20 mg/dL — AB
Leukocytes,Ua: NEGATIVE
Nitrite: NEGATIVE
Protein, ur: 30 mg/dL — AB
Specific Gravity, Urine: 1.044 — ABNORMAL HIGH (ref 1.005–1.030)
pH: 5 (ref 5.0–8.0)

## 2024-06-01 LAB — CBC
HCT: 45.7 % (ref 36.0–46.0)
Hemoglobin: 14.9 g/dL (ref 12.0–15.0)
MCH: 31.6 pg (ref 26.0–34.0)
MCHC: 32.6 g/dL (ref 30.0–36.0)
MCV: 97 fL (ref 80.0–100.0)
Platelets: 294 K/uL (ref 150–400)
RBC: 4.71 MIL/uL (ref 3.87–5.11)
RDW: 13.5 % (ref 11.5–15.5)
WBC: 10.6 K/uL — ABNORMAL HIGH (ref 4.0–10.5)
nRBC: 0 % (ref 0.0–0.2)

## 2024-06-01 LAB — COMPREHENSIVE METABOLIC PANEL WITH GFR
ALT: 53 U/L — ABNORMAL HIGH (ref 0–44)
AST: 43 U/L — ABNORMAL HIGH (ref 15–41)
Albumin: 3.6 g/dL (ref 3.5–5.0)
Alkaline Phosphatase: 130 U/L — ABNORMAL HIGH (ref 38–126)
Anion gap: 9 (ref 5–15)
BUN: 16 mg/dL (ref 8–23)
CO2: 31 mmol/L (ref 22–32)
Calcium: 9.4 mg/dL (ref 8.9–10.3)
Chloride: 100 mmol/L (ref 98–111)
Creatinine, Ser: 0.62 mg/dL (ref 0.44–1.00)
GFR, Estimated: >60 mL/min
Glucose, Bld: 122 mg/dL — ABNORMAL HIGH (ref 70–99)
Potassium: 5.4 mmol/L — ABNORMAL HIGH (ref 3.5–5.1)
Sodium: 139 mmol/L (ref 135–145)
Total Bilirubin: 0.8 mg/dL (ref 0.0–1.2)
Total Protein: 5.8 g/dL — ABNORMAL LOW (ref 6.5–8.1)

## 2024-06-01 LAB — LIPASE, BLOOD: Lipase: 12 U/L (ref 11–51)

## 2024-06-01 LAB — MAGNESIUM: Magnesium: 1.7 mg/dL (ref 1.7–2.4)

## 2024-06-01 MED ORDER — ROSUVASTATIN CALCIUM 10 MG PO TABS
5.0000 mg | ORAL_TABLET | Freq: Every day | ORAL | Status: DC
  Administered 2024-06-02 – 2024-06-04 (×3): 5 mg via ORAL
  Filled 2024-06-01 (×3): qty 1

## 2024-06-01 MED ORDER — OXYCODONE HCL 5 MG PO TABS: 5.0000 mg | ORAL_TABLET | Freq: Four times a day (QID) | ORAL | Status: DC | PRN

## 2024-06-01 MED ORDER — PANTOPRAZOLE SODIUM 40 MG IV SOLR
40.0000 mg | INTRAVENOUS | Status: DC
  Administered 2024-06-01 – 2024-06-04 (×4): 40 mg via INTRAVENOUS
  Filled 2024-06-01 (×4): qty 10

## 2024-06-01 MED ORDER — ONDANSETRON HCL 4 MG PO TABS: 4.0000 mg | ORAL_TABLET | Freq: Four times a day (QID) | ORAL | Status: DC | PRN

## 2024-06-01 MED ORDER — ACETAMINOPHEN 650 MG RE SUPP: 650.0000 mg | Freq: Four times a day (QID) | RECTAL | Status: DC | PRN

## 2024-06-01 MED ORDER — FENTANYL CITRATE (PF) 50 MCG/ML IJ SOSY: 25.0000 ug | PREFILLED_SYRINGE | INTRAMUSCULAR | Status: DC | PRN

## 2024-06-01 MED ORDER — LACTATED RINGERS IV SOLN: INTRAVENOUS | Status: AC

## 2024-06-01 MED ORDER — BISACODYL 5 MG PO TBEC: 5.0000 mg | DELAYED_RELEASE_TABLET | Freq: Every day | ORAL | Status: DC | PRN

## 2024-06-01 MED ORDER — ACETAMINOPHEN 325 MG PO TABS: 650.0000 mg | ORAL_TABLET | Freq: Four times a day (QID) | ORAL | Status: DC | PRN

## 2024-06-01 MED ORDER — HEPARIN SODIUM (PORCINE) 5000 UNIT/ML IJ SOLN
5000.0000 [IU] | Freq: Three times a day (TID) | INTRAMUSCULAR | Status: DC
  Administered 2024-06-02 – 2024-06-03 (×2): 5000 [IU] via SUBCUTANEOUS
  Filled 2024-06-01 (×4): qty 1

## 2024-06-01 MED ORDER — ANASTROZOLE 1 MG PO TABS
1.0000 mg | ORAL_TABLET | Freq: Every day | ORAL | Status: DC
  Filled 2024-06-01 (×2): qty 1

## 2024-06-01 MED ORDER — FENTANYL CITRATE (PF) 100 MCG/2ML IJ SOLN: 25.0000 ug | INTRAMUSCULAR | Status: DC | PRN

## 2024-06-01 MED ORDER — HYDRALAZINE HCL 20 MG/ML IJ SOLN: 5.0000 mg | INTRAMUSCULAR | Status: DC | PRN

## 2024-06-01 MED ORDER — PROCHLORPERAZINE EDISYLATE 10 MG/2ML IJ SOLN: 10.0000 mg | INTRAMUSCULAR | Status: DC | PRN

## 2024-06-01 MED ORDER — METOPROLOL SUCCINATE ER 25 MG PO TB24
25.0000 mg | ORAL_TABLET | Freq: Every day | ORAL | Status: DC
  Administered 2024-06-02 – 2024-06-03 (×2): 25 mg via ORAL
  Filled 2024-06-01 (×3): qty 1

## 2024-06-01 MED ORDER — TRAZODONE HCL 50 MG PO TABS: 25.0000 mg | ORAL_TABLET | Freq: Every evening | ORAL | Status: DC | PRN

## 2024-06-01 MED ORDER — FUROSEMIDE 10 MG/ML IJ SOLN
20.0000 mg | Freq: Once | INTRAMUSCULAR | Status: AC
  Administered 2024-06-01: 20 mg via INTRAVENOUS
  Filled 2024-06-01: qty 2

## 2024-06-01 MED ORDER — ONDANSETRON HCL 4 MG/2ML IJ SOLN
4.0000 mg | Freq: Four times a day (QID) | INTRAMUSCULAR | Status: DC | PRN
  Administered 2024-06-01: 4 mg via INTRAVENOUS
  Filled 2024-06-01: qty 2

## 2024-06-01 NOTE — ED Triage Notes (Signed)
 Pt arrived via POV. Pt came in with chief complaints of abdominal pain that has been going on since the first of October. Pt  was seen by our ER on the 29th of oct and was diagnosed with possible bowel obstruction. Pt states its only gotten worse since. She stated she had a CT was contrast yesterday and it confirmed bowel obstruction. Pt's last bm was last Tuesday (12/2). States she hasn't passed gas since 12/2 as well. Verbalizes difficulty eating due to pain.

## 2024-06-01 NOTE — H&P (Signed)
 History and Physical  [facility]  [name]:[id number] DOB: 04/06/1961 DOA: 06/01/2024  PCP: Vida Mardy DEL, PA-C  Patient coming from: sent from home Level of care: Med-Surg  I have personally briefly reviewed patient's old medical records in [facility]  Chief Complaint: abdominal pain  HPI: Kristin Villa is a 63 year old female with past medical history of left renal cell carcinoma status post surgical partial left nephrectomy, history of breast cancer status postmastectomy and IV chemotherapy, Clark's level 4 superficial spreading melanoma, hyperlipidemia, hypertension, status post partial hysterectomy at age 41, that is post simple left mastectomy, modified radical right mastectomy who presented to the emergency department complaining of abdominal pain.  She recently saw her gastroenterologist and they sent her for CT scan on 05/31/2024 which did reveal a small bowel obstruction.  She was sent to the emergency department for further management.  On arrival she noted that her symptoms started in early October with abdominal pain and bloating which has progressively worsened.  She feels bloated in the abdomen with cramping.  She has nausea vomiting symptoms.  Last BM was 05/28/24  She is not having flatus and has not had for the past few days.  She is having difficulty eating due to abdominal pain.  Her CT scan demonstrated  partial or early high-grade small bowel obstruction, transition point in the central pelvis.  She had a potassium of 5.4, glucose 122, alkaline phosphatase 130, AST 43, ALT 53, total protein 5.8, bilirubin 0.8, WBC 10.6, hemoglobin 14.9 and platelet count 294.  NG tube was placed for decompression and Dr. Mavis with surgery team consulted and hospital admission requested for further management.    Past Medical History:  Diagnosis Date   Anemia    Complication of anesthesia    difficult to wake up after anesthesia   Essential hypertension 06/01/2024    Hyperlipidemia    PONV (postoperative nausea and vomiting)    mild   SCC (squamous cell carcinoma)    face   Tachycardia     Past Surgical History:  Procedure Laterality Date   AXILLARY LYMPH NODE DISSECTION Right 09/02/2021   Procedure: AXILLARY LYMPH NODE DISSECTION;  Surgeon: Mavis Anes, MD;  Location: [facility];  Service: General;  Laterality: Right;   BREAST BIOPSY Right    x3   CATARACT EXTRACTION W/PHACO Right 07/27/2021   Procedure: CATARACT EXTRACTION PHACO AND INTRAOCULAR LENS PLACEMENT (IOC) RIGHT 7.43 00:35.1;  Surgeon: Jaye Fallow, MD;  Location: [facility];  Service: Ophthalmology;  Laterality: Right;  needs later arrival   CATARACT EXTRACTION W/PHACO Left 08/10/2021   Procedure: CATARACT EXTRACTION PHACO AND INTRAOCULAR LENS PLACEMENT (IOC) LEFT 7.59 00:42.1;  Surgeon: Jaye Fallow, MD;  Location: [facility];  Service: Ophthalmology;  Laterality: Left;   MASTECTOMY MODIFIED RADICAL Right 07/16/2021   Procedure: MASTECTOMY MODIFIED RADICAL;  Surgeon: Mavis Anes, MD;  Location: [facility];  Service: General;  Laterality: Right;     reports that she has been smoking cigarettes. She has a 22.5 pack-year smoking history. She has never used smokeless tobacco. She reports that she does not drink alcohol and does not use drugs.  Allergies  Allergen Reactions   Gadolinium Derivatives     Family History  Problem Relation Age of Onset   Dementia Father    Cancer Maternal Grandmother        colon   Cancer Maternal Grandfather        lung    Prior to Admission  medications   Medication Sig Start Date End Date Taking? Authorizing Provider  acetaminophen  (TYLENOL ) 650 MG CR tablet Take 650 mg by mouth every 8 (eight) hours as needed for pain.    [provider]  CHOLECALCIFEROL 25 MCG (1000 UNIT) tablet Take 1,000 Units by mouth daily.    [provider]  cyclobenzaprine  (FLEXERIL ) 10 MG tablet Take 5 mg by mouth 3 (three) times  daily as needed for muscle spasms.    [provider]  HYDROcodone -acetaminophen  (NORCO) 7.5-325 MG tablet Take 1 tablet by mouth every 4 (four) hours as needed for moderate pain. Patient not taking: Reported on 09/07/2021 09/02/21   Mavis Anes, MD  metoprolol  succinate (TOPROL -XL) 25 MG 24 hr tablet Take 25 mg by mouth daily. 06/21/21   [provider]  ondansetron  (ZOFRAN -ODT) 4 MG disintegrating tablet Take 1 tablet (4 mg total) by mouth every 8 (eight) hours as needed for nausea or vomiting. 03/26/24   Horton, Charmaine FALCON, MD  rosuvastatin  (CRESTOR ) 5 MG tablet Take 5 mg by mouth daily.    [provider]    Physical Exam: Vitals:   06/01/24 9193 06/01/24 0808 06/01/24 0812 06/01/24 0815  BP:    119/64  Pulse: (!) 126   (!) 109  Resp:    16  Temp:  98.7 F (37.1 C)    TempSrc:  Oral    SpO2: 98%   96%  Weight:   66.2 kg   Height:   5' 7 (1.702 m)     Constitutional: thin female, NAD, calm, comfortable Eyes: PERRL, strabismus, lids and conjunctivae normal ENMT: Mucous membranes are moist. Posterior pharynx clear of any exudate or lesions.Normal dentition.  Neck: normal, supple, no masses, no thyromegaly Respiratory: clear to auscultation bilaterally, no wheezing, no crackles. Normal respiratory effort. No accessory muscle use.  Cardiovascular: normal s1, s2 sounds, no murmurs / rubs / gallops. No extremity edema. 2+ pedal pulses. No carotid bruits.  Abdomen: distended, mild generalized tenderness, no masses palpated. No hepatosplenomegaly. Bowel sounds hypoactive.  Musculoskeletal: no clubbing / cyanosis. No joint deformity upper and lower extremities. Good ROM, no contractures. Normal muscle tone.  Skin: no rashes, lesions, ulcers. No induration Neurologic: CN 2-12 grossly intact. Sensation intact, DTR normal. Strength 5/5 in all 4.  Psychiatric: Normal judgment and insight. Alert and oriented x 3. Normal mood.   Labs on Admission: I have personally  reviewed following labs and imaging studies  CBC: Recent Labs  Lab 06/01/24 0824  WBC 10.6*  HGB 14.9  HCT 45.7  MCV 97.0  PLT 294   Basic Metabolic Panel: Recent Labs  Lab 06/01/24 0824  NA 139  K 5.4*  CL 100  CO2 31  GLUCOSE 122*  BUN 16  CREATININE 0.62  CALCIUM  9.4   GFR: Estimated Creatinine Clearance: 68.2 mL/min (by C-G formula based on SCr of 0.62 mg/dL). Liver Function Tests: Recent Labs  Lab 06/01/24 0824  AST 43*  ALT 53*  ALKPHOS 130*  BILITOT 0.8  PROT 5.8*  ALBUMIN 3.6   Recent Labs  Lab 06/01/24 0824  LIPASE 12   No results for input(s): AMMONIA in the last 168 hours. Coagulation Profile: No results for input(s): INR, PROTIME in the last 168 hours. Cardiac Enzymes: No results for input(s): CKTOTAL, CKMB, CKMBINDEX, TROPONINI in the last 168 hours. BNP (last 3 results) No results for input(s): PROBNP in the last 8760 hours. HbA1C: No results for input(s): HGBA1C in the last 72 hours. CBG: No results  for input(s): GLUCAP in the last 168 hours. Lipid Profile: No results for input(s): CHOL, HDL, LDLCALC, TRIG, CHOLHDL, LDLDIRECT in the last 72 hours. Thyroid Function Tests: No results for input(s): TSH, T4TOTAL, FREET4, T3FREE, THYROIDAB in the last 72 hours. Anemia Panel: No results for input(s): VITAMINB12, FOLATE, FERRITIN, TIBC, IRON, RETICCTPCT in the last 72 hours. Urine analysis:    Component Value Date/Time   COLORURINE AMBER (A) 06/01/2024 0814   APPEARANCEUR HAZY (A) 06/01/2024 0814   LABSPEC 1.044 (H) 06/01/2024 0814   PHURINE 5.0 06/01/2024 0814   GLUCOSEU NEGATIVE 06/01/2024 0814   HGBUR NEGATIVE 06/01/2024 0814   BILIRUBINUR NEGATIVE 06/01/2024 0814   KETONESUR 20 (A) 06/01/2024 0814   PROTEINUR 30 (A) 06/01/2024 0814   UROBILINOGEN 0.2 01/09/2007 1103   NITRITE NEGATIVE 06/01/2024 0814   LEUKOCYTESUR NEGATIVE 06/01/2024 0814    Radiological Exams on  Admission: CT ABDOMEN PELVIS W CONTRAST Result Date: 05/31/2024 CLINICAL DATA:  Small-bowel obstruction EXAM: CT ABDOMEN AND PELVIS WITH CONTRAST TECHNIQUE: Multidetector CT imaging of the abdomen and pelvis was performed using the standard protocol following bolus administration of intravenous contrast. RADIATION DOSE REDUCTION: This exam was performed according to the departmental dose-optimization program which includes automated exposure control, adjustment of the mA and/or kV according to patient size and/or use of iterative reconstruction technique. CONTRAST:  OMNIPAQUE  IOHEXOL  300 MG/ML  SOLN COMPARISON:  03/26/2024, 02/20/2023 FINDINGS: Lower chest: No focal airspace consolidation or pleural effusion. Posterior bibasilar dependent atelectasis. Hepatobiliary: No mass.Diffuse hepatic steatosis.No radiopaque stones or wall thickening of the gallbladder. No intrahepatic or extrahepatic biliary ductal dilation. The portal veins are patent. Pancreas: No mass or main ductal dilation. No peripancreatic inflammation or fluid collection. 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 Adrenals/Urinary Tract: Unchanged 1.8 cm left adrenal nodule, consistent with an adrenal adenoma. Partial left nephrectomy. No renal mass. No nephrolithiasis or hydronephrosis. The urinary bladder is distended without focal abnormality. Stomach/Bowel: The stomach contains ingested material without focal abnormality. Abnormal fluid-filled dilation of multiple segments of small bowel extending into the right lower quadrant, where there is an abrupt transition point in the central pelvis (axial 61-54, coronal 85).The majority of the distal ileum is completely decompressed as is the majority of the colon.Normal appendix. Mild wall thickening of the small bowel at the transition point. Vascular/Lymphatic: No aortic aneurysm. No intraabdominal or pelvic lymphadenopathy. Reproductive:  Hysterectomy. No concerning adnexal mass. Other: No pneumoperitoneum.  Small volume ascites. Musculoskeletal: No acute fracture or destructive lesion. Multilevel degenerative disc disease of the spine. IMPRESSION: 1. Findings consistent with either a partial or early high-grade small bowel obstruction, transition point in the central pelvis. This may be due to underlying adhesions or focal enteritis at the transition point (coronal 85). Small volume ascites is likely reactive. 2. In the spleen, there is a round arterially enhancing lesion measuring 8 mm, likely a benign hemangioma. Attention on follow-up imaging is recommended document stability. 3. Postsurgical changes consistent with prior nephrectomy. No recurrent soft tissue or mass in the operative bed. These results will be called to the ordering clinician or representative by the Radiologist Assistant and communication documented in the PACS or [employer]. Electronically Signed   By: Rogelia Myers M.D.   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   S/p nephrectomy   Hyperkalemia   Generalized abdominal pain   Essential hypertension  Hyperlipidemia   SBO -- pt symptomatic on presentation -- agree with NG tube placement and bowel decompression -- maintain NPO status -- start supportive measures -- IV fluids, correction of lytes -- IV pain and nausea medication ordered  -- pt has had several abdominal surgeries and may have adhesions -- appreciate general surgery team consultation  Hyperkalemia (mild) -- IV lasix  30 mg x 1 dose ordered -- recheck  -- IV fluids   Generalized abdominal pain -- anticipate improvement after NG placement to suction for decompression  Essential hypertension  -- resume home meds when reconciled  History of left renal cell carcinoma -- s/p partial left nephrectomy -- no recurrence of soft  tissue or mass reported in the postop bed  History of breast cancer -- s/p bilateral mastectomy -- s/p chemotherapy   Hyperlipidemia -- resume home meds when reconciled   DVT prophylaxis: sq heparin    Code Status: full   Family Communication:   Disposition Plan:   Consults called: surgeon  Admission status: INP Time spent: 60 mins  Level of care: Med-Surg Afton Louder MD [facility] How to contact the [facility] Attending or Consulting provider 7A - 7P or covering provider during after hours 7P -7A, for this patient?  Check the care team in [facility] and look for a) attending/consulting [facility] provider listed and b) the [facility] team listed Log into [email or web address] and use [facility]'s universal password to access. If you do not have the password, please contact the hospital operator. Locate the [facility] provider you are looking for under [facility] and page to a number that you can be directly reached. If you still have difficulty reaching the provider, please page the [facility] (Director on Call) for the Hospitalists listed on amion for assistance.   If 7PM-7AM, please contact night-coverage [email or web address] Password [email or web address]  06/01/2024, 10:23 AM

## 2024-06-01 NOTE — ED Provider Notes (Signed)
 [place] EMERGENCY DEPARTMENT AT [facility] Provider Note   CSN: [id number] Arrival date & time: 06/01/24  9244     Patient presents with: No chief complaint on file.   Kristin Villa is a 63 y.o. female.   HPI 63 year old female presenting today with chief complaint of abdominal pain.  Patient states that she was seen by her doctor who was concerned about a small bowel obstruction.  They sent her for a CT on 12/05 which showed a small bowel obstruction.  They then recommended that she come into the ER for admission and NG tube.  Patient states that this all started in late September or early October.  She was seen in the ED for this then however it was smaller and she did not want to be admitted.  Since then things have gotten worse.  She states that her abdominal bloating has gotten worse she has gone from a size 8 and pants to a 12.  States that she is very uncomfortable and in a lot of pain.  She feels as though she can feel the cramping moving in her abdomen.  She also complains of some nausea and vomiting but however has not vomited in a week or so.  She is not having any other symptoms.  Her last bowel movement was Tuesday December second.  At that time she says that it was a regular bowel movement.  He is not having any urinary symptoms.  She reports not being able to eat and/or drink much without pain and/or vomiting.  Patient has a positive surgical history with her last surgery being early March 2024.  This was a laparoscopic partial nephrectomy for her renal cell carcinoma. Prior to Admission medications   Medication Sig Start Date End Date Taking? Authorizing Provider  acetaminophen  (TYLENOL ) 650 MG CR tablet Take 650 mg by mouth every 8 (eight) hours as needed for pain.    [provider]  CHOLECALCIFEROL 25 MCG (1000 UNIT) tablet Take 1,000 Units by mouth daily.    [provider]  cyclobenzaprine  (FLEXERIL ) 10 MG tablet Take 5 mg by mouth 3 (three)  times daily as needed for muscle spasms.    [provider]  HYDROcodone -acetaminophen  (NORCO) 7.5-325 MG tablet Take 1 tablet by mouth every 4 (four) hours as needed for moderate pain. Patient not taking: Reported on 09/07/2021 09/02/21   Mavis Anes, MD  metoprolol  succinate (TOPROL -XL) 25 MG 24 hr tablet Take 25 mg by mouth daily. 06/21/21   [provider]  ondansetron  (ZOFRAN -ODT) 4 MG disintegrating tablet Take 1 tablet (4 mg total) by mouth every 8 (eight) hours as needed for nausea or vomiting. 03/26/24   Horton, Charmaine FALCON, MD  rosuvastatin  (CRESTOR ) 5 MG tablet Take 5 mg by mouth daily.    [provider]    Allergies: Gadolinium derivatives    Review of Systems  All other systems reviewed and are negative.   Updated Vital Signs There were no vitals taken for this visit.  Physical Exam Vitals and nursing note reviewed.  HENT:     Mouth/Throat:     Pharynx: Oropharynx is clear.  Cardiovascular:     Rate and Rhythm: Normal rate.     Pulses: Normal pulses.  Pulmonary:     Effort: Pulmonary effort is normal.     Breath sounds: Normal breath sounds.  Abdominal:     General: Bowel sounds are decreased. There is distension.     Palpations: Abdomen is soft.  Tenderness: There is abdominal tenderness in the right lower quadrant and left lower quadrant.     Hernia: No hernia is present.  Skin:    General: Skin is warm and dry.  Neurological:     General: No focal deficit present.     Mental Status: She is alert.     (all labs ordered are listed, but only abnormal results are displayed) Labs Reviewed - No data to display  EKG: None  Radiology: CT ABDOMEN PELVIS W CONTRAST Result Date: 05/31/2024 CLINICAL DATA:  Small-bowel obstruction EXAM: CT ABDOMEN AND PELVIS WITH CONTRAST TECHNIQUE: Multidetector CT imaging of the abdomen and pelvis was performed using the standard protocol following bolus administration of intravenous contrast.  RADIATION DOSE REDUCTION: This exam was performed according to the departmental dose-optimization program which includes automated exposure control, adjustment of the mA and/or kV according to patient size and/or use of iterative reconstruction technique. CONTRAST:  OMNIPAQUE  IOHEXOL  300 MG/ML  SOLN COMPARISON:  03/26/2024, 02/20/2023 FINDINGS: Lower chest: No focal airspace consolidation or pleural effusion. Posterior bibasilar dependent atelectasis. Hepatobiliary: No mass.Diffuse hepatic steatosis.No radiopaque stones or wall thickening of the gallbladder. No intrahepatic or extrahepatic biliary ductal dilation. The portal veins are patent. Pancreas: No mass or main ductal dilation. No peripancreatic inflammation or fluid collection. 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 Adrenals/Urinary Tract: Unchanged 1.8 cm left adrenal nodule, consistent with an adrenal adenoma. Partial left nephrectomy. No renal mass. No nephrolithiasis or hydronephrosis. The urinary bladder is distended without focal abnormality. Stomach/Bowel: The stomach contains ingested material without focal abnormality. Abnormal fluid-filled dilation of multiple segments of small bowel extending into the right lower quadrant, where there is an abrupt transition point in the central pelvis (axial 61-54, coronal 85).The majority of the distal ileum is completely decompressed as is the majority of the colon.Normal appendix. Mild wall thickening of the small bowel at the transition point. Vascular/Lymphatic: No aortic aneurysm. No intraabdominal or pelvic lymphadenopathy. Reproductive: Hysterectomy. No concerning adnexal mass. Other: No pneumoperitoneum.  Small volume ascites. Musculoskeletal: No acute fracture or destructive lesion. Multilevel degenerative disc disease of the spine. IMPRESSION: 1. Findings consistent with either a partial or early high-grade small bowel obstruction, transition point  in the central pelvis. This may be due to underlying adhesions or focal enteritis at the transition point (coronal 85). Small volume ascites is likely reactive. 2. In the spleen, there is a round arterially enhancing lesion measuring 8 mm, likely a benign hemangioma. Attention on follow-up imaging is recommended document stability. 3. Postsurgical changes consistent with prior nephrectomy. No recurrent soft tissue or mass in the operative bed. These results will be called to the ordering clinician or representative by the Radiologist Assistant and communication documented in the PACS or [employer]. Electronically Signed   By: Rogelia Myers M.D.   On: 05/31/2024 15:07     Procedures   Medications Ordered in the ED - No data to display                                  Medical Decision Making Amount and/or Complexity of Data Reviewed Labs: ordered.     Patient presents to the ED for concern of small bowel obstruction, this involves an extensive number of treatment options, and is a complaint that carries with it a high risk of complications and morbidity.  The differential diagnosis includes small bowel obstruction  Co morbidities that complicate the patient evaluation  Renal cell carcinoma, breast cancer   Additional history obtained:  Additional history obtained from patient Family, Outside Medical Records, and Past Admission   External records from outside source obtained and reviewed including past outpatient notes and recent CT which showed small bowel obstruction.   Lab Tests:  I Ordered, and personally interpreted labs.  The pertinent results include: CBC, CMP, urinalysis   Imaging Studies ordered:  I independently visualized and interpreted imaging which showed small bowel obstruction  I agree with the radiologist interpretation   Cardiac Monitoring:  The patient was maintained on a cardiac monitor.  I personally viewed and interpreted the cardiac monitored  which showed an underlying rhythm of: None   Test Considered:  None   Critical Interventions:  NG tube was started in the ER and hooked to suction.   Consultations Obtained:  Attending requested consultation with the surgical team, Dr. Mavis,  and hospitalist team, and discussed lab and imaging findings as well as pertinent plan - they recommend: Team will see her in consultation.   Problem List / ED Course:  Small bowel obstruction   Reevaluation:  After the interventions noted above, I reevaluated the patient and found that they have :stayed the same   Dispostion:  After consideration of the diagnostic results and the patients response to treatment, I feel that the patent would benefit from admission and consult with surgical team.       Final diagnoses:  None    ED Discharge Orders     None          Rosaline Almarie KANDICE DEVONNA 06/01/24 0951    Cleotilde Rogue, MD 06/01/24 [phone]

## 2024-06-01 NOTE — Progress Notes (Signed)
  Transition of Care (TOC) Screening Note   Patient Details  Name: Kristin Villa Date of Birth: 04/25/1961   Transition of Care [facility]) CM/SW Contact:    Hoy DELENA Bigness, LCSW Phone Number: 06/01/2024, 11:25 AM    Transition of Care Department [facility]) has reviewed patient and no TOC needs have been identified at this time. We will continue to monitor patient advancement through interdisciplinary progression rounds. If new patient transition needs arise, please place a TOC consult.    06/01/24 1125  TOC Brief Assessment  Insurance and Status Reviewed  Patient has primary care physician Yes  Home environment has been reviewed From home  Prior level of function: Independent  Prior/Current Home Services No current home services  Social Drivers of Health Review SDOH reviewed no interventions necessary  Readmission risk has been reviewed Yes  Transition of care needs no transition of care needs at this time

## 2024-06-01 NOTE — Plan of Care (Signed)
   Problem: Education: Goal: Knowledge of General Education information will improve Description Including pain rating scale, medication(s)/side effects and non-pharmacologic comfort measures Outcome: Progressing

## 2024-06-01 NOTE — Hospital Course (Signed)
 62 year old female with past medical history of left renal cell carcinoma status post surgical partial left nephrectomy, history of breast cancer status postmastectomy and IV chemotherapy, Clark's level 4 superficial spreading melanoma, hyperlipidemia, hypertension, status post partial hysterectomy at age 36, that is post simple left mastectomy, modified radical right mastectomy who presented to the emergency department complaining of abdominal pain.  She recently saw her gastroenterologist and they sent her for CT scan on 05/31/2024 which did reveal a small bowel obstruction.  She was sent to the emergency department for further management.  On arrival she noted that her symptoms started in early October with abdominal pain and bloating which has progressively worsened.  She feels bloated in the abdomen with cramping.  She has nausea vomiting symptoms.  Last BM was 05/28/24  She is not having flatus and has not had for the past few days.  She is having difficulty eating due to abdominal pain.  Her CT scan demonstrated  partial or early high-grade small bowel obstruction, transition point in the central pelvis.  She had a potassium of 5.4, glucose 122, alkaline phosphatase 130, AST 43, ALT 53, total protein 5.8, bilirubin 0.8, WBC 10.6, hemoglobin 14.9 and platelet count 294.  NG tube was placed for decompression and Dr. Mavis with surgery team consulted and hospital admission requested for further management.

## 2024-06-02 ENCOUNTER — Inpatient Hospital Stay (HOSPITAL_COMMUNITY)

## 2024-06-02 DIAGNOSIS — E875 Hyperkalemia: Secondary | ICD-10-CM

## 2024-06-02 DIAGNOSIS — R1084 Generalized abdominal pain: Secondary | ICD-10-CM | POA: Diagnosis not present

## 2024-06-02 DIAGNOSIS — K56609 Unspecified intestinal obstruction, unspecified as to partial versus complete obstruction: Secondary | ICD-10-CM

## 2024-06-02 DIAGNOSIS — E785 Hyperlipidemia, unspecified: Secondary | ICD-10-CM

## 2024-06-02 DIAGNOSIS — I10 Essential (primary) hypertension: Secondary | ICD-10-CM | POA: Diagnosis not present

## 2024-06-02 LAB — COMPREHENSIVE METABOLIC PANEL WITH GFR
ALT: 34 U/L (ref 0–44)
AST: 26 U/L (ref 15–41)
Albumin: 2.8 g/dL — ABNORMAL LOW (ref 3.5–5.0)
Alkaline Phosphatase: 96 U/L (ref 38–126)
Anion gap: 5 (ref 5–15)
BUN: 16 mg/dL (ref 8–23)
CO2: 31 mmol/L (ref 22–32)
Calcium: 8.3 mg/dL — ABNORMAL LOW (ref 8.9–10.3)
Chloride: 102 mmol/L (ref 98–111)
Creatinine, Ser: 0.64 mg/dL (ref 0.44–1.00)
GFR, Estimated: >60 mL/min (ref >60)
Glucose, Bld: 78 mg/dL (ref 70–99)
Potassium: 3.9 mmol/L (ref 3.5–5.1)
Sodium: 139 mmol/L (ref 135–145)
Total Bilirubin: 0.6 mg/dL (ref 0.0–1.2)
Total Protein: 4.4 g/dL — ABNORMAL LOW (ref 6.5–8.1)

## 2024-06-02 LAB — CBC
HCT: 37.3 % (ref 36.0–46.0)
Hemoglobin: 12.3 g/dL (ref 12.0–15.0)
MCH: 31.9 pg (ref 26.0–34.0)
MCHC: 33 g/dL (ref 30.0–36.0)
MCV: 96.9 fL (ref 80.0–100.0)
Platelets: 221 K/uL (ref 150–400)
RBC: 3.85 MIL/uL — ABNORMAL LOW (ref 3.87–5.11)
RDW: 13.6 % (ref 11.5–15.5)
WBC: 6.7 K/uL (ref 4.0–10.5)
nRBC: 0 % (ref 0.0–0.2)

## 2024-06-02 MED ORDER — DIATRIZOATE MEGLUMINE & SODIUM 66-10 % PO SOLN
90.0000 mL | Freq: Once | ORAL | Status: AC
  Administered 2024-06-02: 90 mL via NASOGASTRIC
  Filled 2024-06-02: qty 90

## 2024-06-02 MED ORDER — MILK AND MOLASSES ENEMA: 1.0000 | Freq: Once | RECTAL | Status: DC

## 2024-06-02 MED ORDER — ANASTROZOLE 1 MG PO TABS
1.0000 mg | ORAL_TABLET | Freq: Every day | ORAL | Status: DC
  Administered 2024-06-02 – 2024-06-03 (×2): 1 mg via ORAL
  Filled 2024-06-02 (×3): qty 1

## 2024-06-02 NOTE — Plan of Care (Signed)
   Problem: Education: Goal: Knowledge of General Education information will improve Description Including pain rating scale, medication(s)/side effects and non-pharmacologic comfort measures Outcome: Progressing

## 2024-06-02 NOTE — Progress Notes (Signed)
 PROGRESS NOTE   Kristin Villa  FMW:[id number] DOB: 1961/01/09 DOA: 06/01/2024 PCP: Vida Mardy DEL, PA-C   Chief Complaint  Patient presents with   Abdominal Pain   Level of care: Med-Surg  Brief Admission History:  63 year old female with past medical history of left renal cell carcinoma status post surgical partial left nephrectomy, history of breast cancer status postmastectomy and IV chemotherapy, Clark's level 4 superficial spreading melanoma, hyperlipidemia, hypertension, status post partial hysterectomy at age 19, that is post simple left mastectomy, modified radical right mastectomy who presented to the emergency department complaining of abdominal pain.  She recently saw her gastroenterologist and they sent her for CT scan on 05/31/2024 which did reveal a small bowel obstruction.  She was sent to the emergency department for further management.  On arrival she noted that her symptoms started in early October with abdominal pain and bloating which has progressively worsened.  She feels bloated in the abdomen with cramping.  She has nausea vomiting symptoms.  Last BM was 05/28/24  She is not having flatus and has not had for the past few days.  She is having difficulty eating due to abdominal pain.  Her CT scan demonstrated  partial or early high-grade small bowel obstruction, transition point in the central pelvis.  She had a potassium of 5.4, glucose 122, alkaline phosphatase 130, AST 43, ALT 53, total protein 5.8, bilirubin 0.8, WBC 10.6, hemoglobin 14.9 and platelet count 294.  NG tube was placed for decompression and Dr. Mavis with surgery team consulted and hospital admission requested for further management.   Assessment and Plan:  Partial SBO -- pt symptomatic on presentation but improving now with supportive care  -- NG tube placement for bowel decompression -- maintain NPO status -- continue supportive measures -- IV fluids, correction of lytes -- IV pain and nausea  medication ordered  -- pt has had several abdominal surgeries and may have adhesions -- appreciate general surgery team consultation -- enema ordered today and small bowel follow thru study   Hyperkalemia (mild)  -- resolved  -- IV lasix  30 mg x 1 dose ordered -- resolved    Generalized abdominal pain -- improvement after NG placement to suction for decompression   Essential hypertension  -- resume home meds when reconciled   History of left renal cell carcinoma -- s/p partial left nephrectomy -- no recurrence of soft tissue or mass reported in the postop bed   History of breast cancer -- s/p bilateral mastectomy -- s/p chemotherapy    Hyperlipidemia -- resume home rosuvastatin  5 mg   DVT prophylaxis: heparin  Code Status: full  Family Communication:  Disposition: home    Consultants:  surgeon Procedures:   Antimicrobials:    Subjective: No BM or flatus, less abdominal pain today.   Objective: Vitals:   06/01/24 1758 06/01/24 2100 06/02/24 0424 06/02/24 1332  BP: (!) 130/54 122/76 (!) 122/56 125/63  Pulse: (!) 103 (!) 110 (!) 103 (!) 101  Resp: 16 18 16 16   Temp: 98.8 F (37.1 C) 99 F (37.2 C) 98.6 F (37 C) 98.4 F (36.9 C)  TempSrc: Oral Oral Oral Oral  SpO2: 96% 94% 93% 94%  Weight:      Height:        Intake/Output Summary (Last 24 hours) at 06/02/2024 1417 Last data filed at 06/02/2024 0900 Gross per 24 hour  Intake 907.95 ml  Output 350 ml  Net 557.95 ml   Filed Weights   06/01/24 0812 06/01/24 1028  Weight: 66.2 kg 62.3 kg   Examination:  General exam: Appears calm and comfortable  Respiratory system: Clear to auscultation. Respiratory effort normal. Cardiovascular system: normal S1 & S2 heard. No JVD, murmurs, rubs, gallops or clicks. No pedal edema. Gastrointestinal system: Abdomen is nondistended, soft and nontender. No organomegaly or masses felt. Normal bowel sounds heard. Central nervous system: Alert and oriented. No focal  neurological deficits. Extremities: Symmetric 5 x 5 power. Skin: No rashes, lesions or ulcers. Psychiatry: Judgement and insight appear normal. Mood & affect appropriate.   Data Reviewed: I have personally reviewed following labs and imaging studies  CBC: Recent Labs  Lab 06/01/24 0824 06/02/24 0506  WBC 10.6* 6.7  HGB 14.9 12.3  HCT 45.7 37.3  MCV 97.0 96.9  PLT 294 221    Basic Metabolic Panel: Recent Labs  Lab 06/01/24 0824 06/02/24 0500 06/02/24 0506  NA 139  --  139  K 5.4*  --  3.9  CL 100  --  102  CO2 31  --  31  GLUCOSE 122*  --  78  BUN 16  --  16  CREATININE 0.62  --  0.64  CALCIUM  9.4  --  8.3*  MG  --  1.7  --     CBG: No results for input(s): GLUCAP in the last 168 hours.  No results found for this or any previous visit (from the past 240 hours).   Radiology Studies: DG Chest Portable 1 View Result Date: 06/01/2024 CLINICAL DATA:  Nasogastric tube placement verification. EXAM: PORTABLE CHEST 1 VIEW COMPARISON:  05/07/2021 FINDINGS: Nasogastric tube extends down the esophagus and the tip is in the left upper quadrant of the abdomen. The side hole is near the GE junction. Nasogastric tube tip is in the proximal stomach. Mild elevation of the right hemidiaphragm. Probable atelectasis in the right mid lung. Otherwise, the lungs are clear. Heart size is normal. Atherosclerotic calcifications at the aortic arch. IMPRESSION: 1. Nasogastric tube tip is in the proximal stomach. Electronically Signed   By: Juliene Balder M.D.   On: 06/01/2024 10:34   CT ABDOMEN PELVIS W CONTRAST Result Date: 05/31/2024 CLINICAL DATA:  Small-bowel obstruction EXAM: CT ABDOMEN AND PELVIS WITH CONTRAST TECHNIQUE: Multidetector CT imaging of the abdomen and pelvis was performed using the standard protocol following bolus administration of intravenous contrast. RADIATION DOSE REDUCTION: This exam was performed according to the departmental dose-optimization program which includes automated  exposure control, adjustment of the mA and/or kV according to patient size and/or use of iterative reconstruction technique. CONTRAST:  OMNIPAQUE  IOHEXOL  300 MG/ML  SOLN COMPARISON:  03/26/2024, 02/20/2023 FINDINGS: Lower chest: No focal airspace consolidation or pleural effusion. Posterior bibasilar dependent atelectasis. Hepatobiliary: No mass.Diffuse hepatic steatosis.No radiopaque stones or wall thickening of the gallbladder. No intrahepatic or extrahepatic biliary ductal dilation. The portal veins are patent. Pancreas: No mass or main ductal dilation. No peripancreatic inflammation or fluid collection. 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 Adrenals/Urinary Tract: Unchanged 1.8 cm left adrenal nodule, consistent with an adrenal adenoma. Partial left nephrectomy. No renal mass. No nephrolithiasis or hydronephrosis. The urinary bladder is distended without focal abnormality. Stomach/Bowel: The stomach contains ingested material without focal abnormality. Abnormal fluid-filled dilation of multiple segments of small bowel extending into the right lower quadrant, where there is an abrupt transition point in the central pelvis (axial 61-54, coronal 85).The majority of the distal ileum is completely decompressed as is the majority of the colon.Normal appendix.  Mild wall thickening of the small bowel at the transition point. Vascular/Lymphatic: No aortic aneurysm. No intraabdominal or pelvic lymphadenopathy. Reproductive: Hysterectomy. No concerning adnexal mass. Other: No pneumoperitoneum.  Small volume ascites. Musculoskeletal: No acute fracture or destructive lesion. Multilevel degenerative disc disease of the spine. IMPRESSION: 1. Findings consistent with either a partial or early high-grade small bowel obstruction, transition point in the central pelvis. This may be due to underlying adhesions or focal enteritis at the transition point (coronal 85). Small  volume ascites is likely reactive. 2. In the spleen, there is a round arterially enhancing lesion measuring 8 mm, likely a benign hemangioma. Attention on follow-up imaging is recommended document stability. 3. Postsurgical changes consistent with prior nephrectomy. No recurrent soft tissue or mass in the operative bed. These results will be called to the ordering clinician or representative by the Radiologist Assistant and communication documented in the PACS or [employer]. Electronically Signed   By: Rogelia Myers M.D.   On: 05/31/2024 15:07   Scheduled Meds:  anastrozole   1 mg Oral QHS   heparin   5,000 Units Subcutaneous Q8H   metoprolol  succinate  25 mg Oral Daily   pantoprazole  (PROTONIX ) IV  40 mg Intravenous Q24H   rosuvastatin   5 mg Oral Daily   Continuous Infusions:  lactated ringers  50 mL/hr at 06/01/24 2229    LOS: 1 day   Time spent: 58 mins  Maddex Garlitz Vicci, MD How to contact the [facility] Attending or Consulting provider 7A - 7P or covering provider during after hours 7P -7A, for this patient?  Check the care team in [facility] and look for a) attending/consulting [facility] provider listed and b) the [facility] team listed Log into [email or web address] to find provider on call.  Locate the [facility] provider you are looking for under [facility] and page to a number that you can be directly reached. If you still have difficulty reaching the provider, please page the [facility] (Director on Call) for the Hospitalists listed on amion for assistance.  06/02/2024, 2:17 PM

## 2024-06-02 NOTE — Consult Note (Signed)
 Reason for Consult: Small bowel obstruction Referring Physician: Dr. Johnson  Kristin Villa is an 63 y.o. female.  HPI: Patient is a 63 year old white female with a longstanding history of constipation, left renal cell carcinoma status post partial nephrectomy, history of breast cancer treated with mastectomy and chemotherapy, hypertension, and partial hysterectomy who presents with abdominal bloating and pain.  She did have some nausea and vomiting.  Her last bowel movement was 05/28/2024.  She has had decreased appetite due to her abdominal bloating.  She states her last colonoscopy was over 10 years ago.  CT scan of the abdomen and pelvis done in the emergency room revealed possible developing small bowel obstruction in the distal small bowel.  An NG tube was placed and she was admitted to the hospital for further evaluation and treatment.  Since being in the hospital, she has not had flatus or a bowel movement.  Her abdominal discomfort has somewhat eased.  Past Medical History:  Diagnosis Date   Anemia    Complication of anesthesia    difficult to wake up after anesthesia   Essential hypertension 06/01/2024   Hyperlipidemia    PONV (postoperative nausea and vomiting)    mild   SCC (squamous cell carcinoma)    face   Tachycardia     Past Surgical History:  Procedure Laterality Date   AXILLARY LYMPH NODE DISSECTION Right 09/02/2021   Procedure: AXILLARY LYMPH NODE DISSECTION;  Surgeon: Mavis Anes, MD;  Location: [facility];  Service: General;  Laterality: Right;   BREAST BIOPSY Right    x3   CATARACT EXTRACTION W/PHACO Right 07/27/2021   Procedure: CATARACT EXTRACTION PHACO AND INTRAOCULAR LENS PLACEMENT (IOC) RIGHT 7.43 00:35.1;  Surgeon: Jaye Fallow, MD;  Location: [facility];  Service: Ophthalmology;  Laterality: Right;  needs later arrival   CATARACT EXTRACTION W/PHACO Left 08/10/2021   Procedure: CATARACT EXTRACTION PHACO AND INTRAOCULAR LENS PLACEMENT (IOC) LEFT 7.59  00:42.1;  Surgeon: Jaye Fallow, MD;  Location: [facility];  Service: Ophthalmology;  Laterality: Left;   MASTECTOMY MODIFIED RADICAL Right 07/16/2021   Procedure: MASTECTOMY MODIFIED RADICAL;  Surgeon: Mavis Anes, MD;  Location: [facility];  Service: General;  Laterality: Right;    Family History  Problem Relation Age of Onset   Dementia Father    Cancer Maternal Grandmother        colon   Cancer Maternal Grandfather        lung    Social History:  reports that she has been smoking cigarettes. She has a 22.5 pack-year smoking history. She has never used smokeless tobacco. She reports that she does not drink alcohol and does not use drugs.  Allergies: No Active Allergies  Medications: I have reviewed the patient's current medications. Prior to Admission:  Medications Prior to Admission  Medication Sig Dispense Refill Last Dose/Taking   anastrozole  (ARIMIDEX ) 1 MG tablet Take 1 mg by mouth daily.   Past Week   CHOLECALCIFEROL 25 MCG (1000 UNIT) tablet Take 1,000 Units by mouth daily.   Past Week   cyclobenzaprine  (FLEXERIL ) 10 MG tablet Take 5 mg by mouth 3 (three) times daily as needed for muscle spasms.   Past Week   metoprolol  succinate (TOPROL -XL) 25 MG 24 hr tablet Take 25 mg by mouth daily.   Past Week   naproxen sodium (ALEVE) 220 MG tablet Take 220 mg by mouth every 12 (twelve) hours.   Past Week   ondansetron  (ZOFRAN -ODT) 4 MG disintegrating tablet Take 1 tablet (4  mg total) by mouth every 8 (eight) hours as needed for nausea or vomiting. 20 tablet 0 Past Week   rosuvastatin  (CRESTOR ) 5 MG tablet Take 5 mg by mouth daily.   Past Week    Results for orders placed or performed during the hospital encounter of 06/01/24 (from the past 48 hours)  Urinalysis, Routine w reflex microscopic -Urine, Clean Catch     Status: Abnormal   Collection Time: 06/01/24  8:14 AM  Result Value Ref Range   Color, Urine AMBER (A) YELLOW    Comment: BIOCHEMICALS MAY BE AFFECTED BY  COLOR   APPearance HAZY (A) CLEAR   Specific Gravity, Urine 1.044 (H) 1.005 - 1.030   pH 5.0 5.0 - 8.0   Glucose, UA NEGATIVE NEGATIVE mg/dL   Hgb urine dipstick NEGATIVE NEGATIVE   Bilirubin Urine NEGATIVE NEGATIVE   Ketones, ur 20 (A) NEGATIVE mg/dL   Protein, ur 30 (A) NEGATIVE mg/dL   Nitrite NEGATIVE NEGATIVE   Leukocytes,Ua NEGATIVE NEGATIVE   RBC / HPF 0-5 0 - 5 RBC/hpf   WBC, UA 0-5 0 - 5 WBC/hpf   Bacteria, UA RARE (A) NONE SEEN   Squamous Epithelial / HPF 6-10 0 - 5 /HPF   Mucus PRESENT    Ca Oxalate Crys, UA PRESENT     Comment: Performed at [facility], [address], [place], [place] [address]  Lipase, blood     Status: None   Collection Time: 06/01/24  8:24 AM  Result Value Ref Range   Lipase 12 11 - 51 U/L    Comment: Performed at [facility], [address] [place], [place] [address]  Comprehensive metabolic panel     Status: Abnormal   Collection Time: 06/01/24  8:24 AM  Result Value Ref Range   Sodium 139 135 - 145 mmol/L   Potassium 5.4 (H) 3.5 - 5.1 mmol/L   Chloride 100 98 - 111 mmol/L   CO2 31 22 - 32 mmol/L   Glucose, Bld 122 (H) 70 - 99 mg/dL    Comment: Glucose reference range applies only to samples taken after fasting for at least 8 hours.   BUN 16 8 - 23 mg/dL   Creatinine, Ser 9.37 0.44 - 1.00 mg/dL   Calcium  9.4 8.9 - 10.3 mg/dL   Total Protein 5.8 (L) 6.5 - 8.1 g/dL   Albumin 3.6 3.5 - 5.0 g/dL   AST 43 (H) 15 - 41 U/L   ALT 53 (H) 0 - 44 U/L   Alkaline Phosphatase 130 (H) 38 - 126 U/L   Total Bilirubin 0.8 0.0 - 1.2 mg/dL   GFR, Estimated >39 >39 mL/min    Comment: (NOTE) Calculated using the CKD-EPI Creatinine Equation (2021)    Anion gap 9 5 - 15    Comment: Performed at [facility], [address], [place], [place] [address]  CBC     Status: Abnormal   Collection Time: 06/01/24  8:24 AM  Result Value Ref Range   WBC 10.6 (H) 4.0 - 10.5 K/uL   RBC 4.71 3.87 - 5.11 MIL/uL   Hemoglobin 14.9 12.0 - 15.0 g/dL   HCT 54.2 63.9 - 53.9  %   MCV 97.0 80.0 - 100.0 fL   MCH 31.6 26.0 - 34.0 pg   MCHC 32.6 30.0 - 36.0 g/dL   RDW 86.4 88.4 - 84.4 %   Platelets 294 150 - 400 K/uL   nRBC 0.0 0.0 - 0.2 %    Comment: Performed at [facility], [address] [place], [place] [address]  Magnesium      Status: None   Collection Time: 06/02/24  5:00 AM  Result Value Ref Range   Magnesium  1.7 1.7 - 2.4 mg/dL    Comment: Performed at [facility], [address], [place], [place] [address]  Comprehensive metabolic panel     Status: Abnormal   Collection Time: 06/02/24  5:06 AM  Result Value Ref Range   Sodium 139 135 - 145 mmol/L   Potassium 3.9 3.5 - 5.1 mmol/L    Comment: Delta check noted    Chloride 102 98 - 111 mmol/L   CO2 31 22 - 32 mmol/L   Glucose, Bld 78 70 - 99 mg/dL    Comment: Glucose reference range applies only to samples taken after fasting for at least 8 hours.   BUN 16 8 - 23 mg/dL   Creatinine, Ser 9.35 0.44 - 1.00 mg/dL   Calcium  8.3 (L) 8.9 - 10.3 mg/dL   Total Protein 4.4 (L) 6.5 - 8.1 g/dL   Albumin 2.8 (L) 3.5 - 5.0 g/dL   AST 26 15 - 41 U/L   ALT 34 0 - 44 U/L   Alkaline Phosphatase 96 38 - 126 U/L   Total Bilirubin 0.6 0.0 - 1.2 mg/dL   GFR, Estimated >39 >39 mL/min    Comment: (NOTE) Calculated using the CKD-EPI Creatinine Equation (2021)    Anion gap 5 5 - 15    Comment: Performed at [facility], [address], [place], [place] [address]  CBC     Status: Abnormal   Collection Time: 06/02/24  5:06 AM  Result Value Ref Range   WBC 6.7 4.0 - 10.5 K/uL   RBC 3.85 (L) 3.87 - 5.11 MIL/uL   Hemoglobin 12.3 12.0 - 15.0 g/dL   HCT 62.6 63.9 - 53.9 %   MCV 96.9 80.0 - 100.0 fL   MCH 31.9 26.0 - 34.0 pg   MCHC 33.0 30.0 - 36.0 g/dL   RDW 86.3 88.4 - 84.4 %   Platelets 221 150 - 400 K/uL   nRBC 0.0 0.0 - 0.2 %    Comment: Performed at [facility], [address], [place], [place] [address]    DG Chest Portable 1 View Result Date: 06/01/2024 CLINICAL DATA:  Nasogastric tube placement  verification. EXAM: PORTABLE CHEST 1 VIEW COMPARISON:  05/07/2021 FINDINGS: Nasogastric tube extends down the esophagus and the tip is in the left upper quadrant of the abdomen. The side hole is near the GE junction. Nasogastric tube tip is in the proximal stomach. Mild elevation of the right hemidiaphragm. Probable atelectasis in the right mid lung. Otherwise, the lungs are clear. Heart size is normal. Atherosclerotic calcifications at the aortic arch. IMPRESSION: 1. Nasogastric tube tip is in the proximal stomach. Electronically Signed   By: Juliene Balder M.D.   On: 06/01/2024 10:34   CT ABDOMEN PELVIS W CONTRAST Result Date: 05/31/2024 CLINICAL DATA:  Small-bowel obstruction EXAM: CT ABDOMEN AND PELVIS WITH CONTRAST TECHNIQUE: Multidetector CT imaging of the abdomen and pelvis was performed using the standard protocol following bolus administration of intravenous contrast. RADIATION DOSE REDUCTION: This exam was performed according to the departmental dose-optimization program which includes automated exposure control, adjustment of the mA and/or kV according to patient size and/or use of iterative reconstruction technique. CONTRAST:  OMNIPAQUE  IOHEXOL  300 MG/ML  SOLN COMPARISON:  03/26/2024, 02/20/2023 FINDINGS: Lower chest: No focal airspace consolidation or pleural effusion. Posterior bibasilar dependent atelectasis.  Hepatobiliary: No mass.Diffuse hepatic steatosis.No radiopaque stones or wall thickening of the gallbladder. No intrahepatic or extrahepatic biliary ductal dilation. The portal veins are patent. Pancreas: No mass or main ductal dilation. No peripancreatic inflammation or fluid collection. 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 Adrenals/Urinary Tract: Unchanged 1.8 cm left adrenal nodule, consistent with an adrenal adenoma. Partial left nephrectomy. No renal mass. No nephrolithiasis or hydronephrosis. The urinary bladder is distended without  focal abnormality. Stomach/Bowel: The stomach contains ingested material without focal abnormality. Abnormal fluid-filled dilation of multiple segments of small bowel extending into the right lower quadrant, where there is an abrupt transition point in the central pelvis (axial 61-54, coronal 85).The majority of the distal ileum is completely decompressed as is the majority of the colon.Normal appendix. Mild wall thickening of the small bowel at the transition point. Vascular/Lymphatic: No aortic aneurysm. No intraabdominal or pelvic lymphadenopathy. Reproductive: Hysterectomy. No concerning adnexal mass. Other: No pneumoperitoneum.  Small volume ascites. Musculoskeletal: No acute fracture or destructive lesion. Multilevel degenerative disc disease of the spine. IMPRESSION: 1. Findings consistent with either a partial or early high-grade small bowel obstruction, transition point in the central pelvis. This may be due to underlying adhesions or focal enteritis at the transition point (coronal 85). Small volume ascites is likely reactive. 2. In the spleen, there is a round arterially enhancing lesion measuring 8 mm, likely a benign hemangioma. Attention on follow-up imaging is recommended document stability. 3. Postsurgical changes consistent with prior nephrectomy. No recurrent soft tissue or mass in the operative bed. These results will be called to the ordering clinician or representative by the Radiologist Assistant and communication documented in the PACS or [employer]. Electronically Signed   By: Rogelia Myers M.D.   On: 05/31/2024 15:07    ROS:  Pertinent items are noted in HPI.  Blood pressure (!) 122/56, pulse (!) 103, temperature 98.6 F (37 C), temperature source Oral, resp. rate 16, height 5' 7 (1.702 m), weight 62.3 kg, SpO2 93%. Physical Exam: Pleasant white female in no acute distress Head is normocephalic, atraumatic Lungs clear to auscultation with good breath sounds  bilaterally Heart examination reveals a regular rate and rhythm without S3, S4, murmurs Abdomen is soft with minimal distention noted.  No rigidity is noted.  No point tenderness is noted.  No hernias are noted.  CT scan images personally reviewed  Assessment/Plan: Impression: Partial small bowel obstruction in the face of chronic constipation. Plan: Will give patient milk of molasses enema and do a small bowel obstruction protocol study.  Further management is pending those results.  Patient is adamant that she does not want surgical intervention unless absolutely necessary.  Oneil Budge 06/02/2024, 11:41 AM

## 2024-06-02 NOTE — Progress Notes (Signed)
 Patient has a gallon size ziploc bag in her room inside of another bag full of prescription meds. Informed patient that she is not allowed to keep the meds in her room. That they would be sent to the pharmacy and returned to her at time of discharge. Patient refused to allow this nurse to remove the meds from her room/bag. Informed the provider, CN and pharmacist. Will inform the oncoming shift this evening.

## 2024-06-03 DIAGNOSIS — K56609 Unspecified intestinal obstruction, unspecified as to partial versus complete obstruction: Secondary | ICD-10-CM | POA: Diagnosis not present

## 2024-06-03 DIAGNOSIS — E785 Hyperlipidemia, unspecified: Secondary | ICD-10-CM | POA: Diagnosis not present

## 2024-06-03 DIAGNOSIS — E875 Hyperkalemia: Secondary | ICD-10-CM | POA: Diagnosis not present

## 2024-06-03 DIAGNOSIS — I10 Essential (primary) hypertension: Secondary | ICD-10-CM | POA: Diagnosis not present

## 2024-06-03 LAB — CBC
HCT: 39.1 % (ref 36.0–46.0)
Hemoglobin: 13 g/dL (ref 12.0–15.0)
MCH: 32.3 pg (ref 26.0–34.0)
MCHC: 33.2 g/dL (ref 30.0–36.0)
MCV: 97 fL (ref 80.0–100.0)
Platelets: 267 K/uL (ref 150–400)
RBC: 4.03 MIL/uL (ref 3.87–5.11)
RDW: 13.6 % (ref 11.5–15.5)
WBC: 9.3 K/uL (ref 4.0–10.5)
nRBC: 0 % (ref 0.0–0.2)

## 2024-06-03 LAB — COMPREHENSIVE METABOLIC PANEL WITH GFR
ALT: 33 U/L (ref 0–44)
AST: 28 U/L (ref 15–41)
Albumin: 3 g/dL — ABNORMAL LOW (ref 3.5–5.0)
Alkaline Phosphatase: 112 U/L (ref 38–126)
Anion gap: 10 (ref 5–15)
BUN: 18 mg/dL (ref 8–23)
CO2: 28 mmol/L (ref 22–32)
Calcium: 8.4 mg/dL — ABNORMAL LOW (ref 8.9–10.3)
Chloride: 102 mmol/L (ref 98–111)
Creatinine, Ser: 0.58 mg/dL (ref 0.44–1.00)
GFR, Estimated: >60 mL/min (ref >60)
Glucose, Bld: 71 mg/dL (ref 70–99)
Potassium: 4.3 mmol/L (ref 3.5–5.1)
Sodium: 140 mmol/L (ref 135–145)
Total Bilirubin: 0.6 mg/dL (ref 0.0–1.2)
Total Protein: 4.8 g/dL — ABNORMAL LOW (ref 6.5–8.1)

## 2024-06-03 LAB — MAGNESIUM: Magnesium: 1.8 mg/dL (ref 1.7–2.4)

## 2024-06-03 MED ORDER — SIMETHICONE 80 MG PO CHEW: 80.0000 mg | CHEWABLE_TABLET | Freq: Four times a day (QID) | ORAL | Status: DC | PRN

## 2024-06-03 MED ORDER — MAGNESIUM SULFATE 2 GM/50ML IV SOLN
2.0000 g | Freq: Once | INTRAVENOUS | Status: AC
  Administered 2024-06-03: 2 g via INTRAVENOUS
  Filled 2024-06-03: qty 50

## 2024-06-03 MED ORDER — ENOXAPARIN SODIUM 40 MG/0.4ML IJ SOSY
40.0000 mg | PREFILLED_SYRINGE | INTRAMUSCULAR | Status: DC
  Administered 2024-06-04: 40 mg via SUBCUTANEOUS
  Filled 2024-06-03: qty 0.4

## 2024-06-03 MED ORDER — POLYETHYLENE GLYCOL 3350 17 G PO PACK
17.0000 g | PACK | Freq: Two times a day (BID) | ORAL | Status: DC
  Filled 2024-06-03 (×2): qty 1

## 2024-06-03 MED ORDER — MAGNESIUM CITRATE PO SOLN: 1.0000 | Freq: Once | ORAL | Status: DC

## 2024-06-03 NOTE — Progress Notes (Signed)
  Subjective: Patient very anxious and teary-eyed.  She was thinking that she needed a colostomy.  She said someone told her that.  She did not have significant results with the soapsuds enema.  She still has abdominal cramping.  Objective: Vital signs in last 24 hours: Temp:  [97.7 F (36.5 C)-98.4 F (36.9 C)] 98 F (36.7 C) (12/08 0815) Pulse Rate:  [90-101] 96 (12/08 0815) Resp:  [16-18] 17 (12/08 0815) BP: (102-126)/(47-85) 126/64 (12/08 0815) SpO2:  [94 %] 94 % (12/08 0815) Last BM Date : 06/02/24  Intake/Output from previous day: No intake/output data recorded. Intake/Output this shift: No intake/output data recorded.  General appearance: alert and anxious GI: Soft, mildly distended.  Occasional bowel sounds appreciated.  No point tenderness or rigidity noted.  Lab Results:  Recent Labs    06/02/24 0506 06/03/24 0437  WBC 6.7 9.3  HGB 12.3 13.0  HCT 37.3 39.1  PLT 221 267   BMET Recent Labs    06/02/24 0506 06/03/24 0437  NA 139 140  K 3.9 4.3  CL 102 102  CO2 31 28  GLUCOSE 78 71  BUN 16 18  CREATININE 0.64 0.58  CALCIUM  8.3* 8.4*   PT/INR No results for input(s): LABPROT, INR in the last 72 hours.  Studies/Results: DG Abd Portable 1V-Small Bowel Obstruction Protocol-initial, 8 hr delay Result Date: 06/02/2024 EXAM: 1 VIEW XRAY OF THE ABDOMEN 06/02/2024 08:48:43 PM COMPARISON: CT abdomen/pelvis 05/31/2024. CLINICAL HISTORY: FINDINGS: LINES, TUBES AND DEVICES: Enteric tube in place with tip in gastric fundus. BOWEL: Dilated small bowel loops up to 5.1 cm. PO contrast reaches the colon. SOFT TISSUES: No abnormal calcifications. BONES: No acute fracture. IMPRESSION: 1. Dilated small bowel loops up to 5.1 cm, concerning for partial small bowel obstruction with PO contrast reaches the colon. 2. Enteric tube in place. Electronically signed by: Morgane Naveau MD 06/02/2024 10:31 PM EST RP Workstation: [id number]   DG Chest Portable 1 View Result Date:  06/01/2024 CLINICAL DATA:  Nasogastric tube placement verification. EXAM: PORTABLE CHEST 1 VIEW COMPARISON:  05/07/2021 FINDINGS: Nasogastric tube extends down the esophagus and the tip is in the left upper quadrant of the abdomen. The side hole is near the GE junction. Nasogastric tube tip is in the proximal stomach. Mild elevation of the right hemidiaphragm. Probable atelectasis in the right mid lung. Otherwise, the lungs are clear. Heart size is normal. Atherosclerotic calcifications at the aortic arch. IMPRESSION: 1. Nasogastric tube tip is in the proximal stomach. Electronically Signed   By: Juliene Balder M.D.   On: 06/01/2024 10:34    Anti-infectives: Anti-infectives (From admission, onward)    None       Assessment/Plan: Impression: Small bowel obstruction protocol study shows dilated loops of small bowel diffusely, but contrast is noted in the colon after 8 hours.  Patient may have a component of dysmotility.  There is no evidence of needing surgical intervention at the present time.  Patient was very anxious about these findings.  I told her that she did not need a colostomy and that that would not be helpful anyway given the study findings.  Will remove NG tube and start cathartics.  LOS: 2 days    Oneil Budge 06/03/2024

## 2024-06-03 NOTE — Progress Notes (Signed)
 PROGRESS NOTE   Kristin Villa  FMW:[id number] DOB: 08-18-1960 DOA: 06/01/2024 PCP: Kristin Mardy DEL, PA-C   Chief Complaint  Patient presents with   Abdominal Pain   Level of care: Med-Surg  Brief Admission History:  63 year old female with past medical history of left renal cell carcinoma status post surgical partial left nephrectomy, history of breast cancer status postmastectomy and IV chemotherapy, Clark's level 4 superficial spreading melanoma, hyperlipidemia, hypertension, status post partial hysterectomy at age 63, that is post simple left mastectomy, modified radical right mastectomy who presented to the emergency department complaining of abdominal pain.  She recently saw her gastroenterologist and they sent her for CT scan on 05/31/2024 which did reveal a small bowel obstruction.  She was sent to the emergency department for further management.  On arrival she noted that her symptoms started in early October with abdominal pain and bloating which has progressively worsened.  She feels bloated in the abdomen with cramping.  She has nausea vomiting symptoms.  Last BM was 05/28/24  She is not having flatus and has not had for the past few days.  She is having difficulty eating due to abdominal pain.  Her CT scan demonstrated  partial or early high-grade small bowel obstruction, transition point in the central pelvis.  She had a potassium of 5.4, glucose 122, alkaline phosphatase 130, AST 43, ALT 53, total protein 5.8, bilirubin 0.8, WBC 10.6, hemoglobin 14.9 and platelet count 294.  NG tube was placed for decompression and Dr. Mavis with surgery team consulted and hospital admission requested for further management.   Assessment and Plan:  Partial SBO-- improving  -- pt symptomatic on presentation but improving now with supportive care  -- NG tube placement for bowel decompression -- NG removed by surgeon 06/03/24 -- continue supportive measures -- diet advanced to clears  -- IV pain and  nausea medication ordered  -- appreciate general surgery team consultation -- small bowel follow thru study reassuring with contrast reaching the colon   Hyperkalemia (mild)  -- resolved  -- IV lasix  30 mg x 1 dose ordered -- resolved    Generalized abdominal pain -- improvement after NG placement to suction for decompression -- had large BM today  -- improving    Essential hypertension  -- resume home meds   History of left renal cell carcinoma -- s/p partial left nephrectomy -- no recurrence of soft tissue or mass reported in the postop bed   History of breast cancer -- s/p bilateral mastectomy -- s/p chemotherapy    Hyperlipidemia -- resumed home rosuvastatin  5 mg   DVT prophylaxis: heparin  Code Status: full  Family Communication:  Disposition: home    Consultants:  surgeon  Procedures:   Antimicrobials:    Subjective: Pt had a large BM this morning.    Objective: Vitals:   06/02/24 2116 06/03/24 0536 06/03/24 0815 06/03/24 0934  BP: 102/85 (!) 113/47 126/64 136/68  Pulse: 90 91 96 (!) 110  Resp: 18 17 17    Temp: 97.7 F (36.5 C) 97.9 F (36.6 C) 98 F (36.7 C)   TempSrc: Oral Oral Oral   SpO2: 94% 94% 94%   Weight:      Height:        Intake/Output Summary (Last 24 hours) at 06/03/2024 1107 Last data filed at 06/03/2024 0900 Gross per 24 hour  Intake 0 ml  Output --  Net 0 ml   Filed Weights   06/01/24 0812 06/01/24 1028  Weight: 66.2 kg 62.3  kg   Examination:  General exam: Appears calm and comfortable  Respiratory system: Clear to auscultation. Respiratory effort normal. Cardiovascular system: normal S1 & S2 heard. No JVD, murmurs, rubs, gallops or clicks. No pedal edema. Gastrointestinal system: Abdomen is nondistended, soft and nontender. No organomegaly or masses felt. Normal bowel sounds heard. Central nervous system: Alert and oriented. No focal neurological deficits. Extremities: Symmetric 5 x 5 power. Skin: No rashes, lesions or  ulcers. Psychiatry: Judgement and insight appear normal. Mood & affect appropriate.   Data Reviewed: I have personally reviewed following labs and imaging studies  CBC: Recent Labs  Lab 06/01/24 0824 06/02/24 0506 06/03/24 0437  WBC 10.6* 6.7 9.3  HGB 14.9 12.3 13.0  HCT 45.7 37.3 39.1  MCV 97.0 96.9 97.0  PLT 294 221 267    Basic Metabolic Panel: Recent Labs  Lab 06/01/24 0824 06/02/24 0500 06/02/24 0506 06/03/24 0437  NA 139  --  139 140  K 5.4*  --  3.9 4.3  CL 100  --  102 102  CO2 31  --  31 28  GLUCOSE 122*  --  78 71  BUN 16  --  16 18  CREATININE 0.62  --  0.64 0.58  CALCIUM  9.4  --  8.3* 8.4*  MG  --  1.7  --  1.8    CBG: No results for input(s): GLUCAP in the last 168 hours.  No results found for this or any previous visit (from the past 240 hours).   Radiology Studies: DG Abd Portable 1V-Small Bowel Obstruction Protocol-initial, 8 hr delay Result Date: 06/02/2024 EXAM: 1 VIEW XRAY OF THE ABDOMEN 06/02/2024 08:48:43 PM COMPARISON: CT abdomen/pelvis 05/31/2024. CLINICAL HISTORY: FINDINGS: LINES, TUBES AND DEVICES: Enteric tube in place with tip in gastric fundus. BOWEL: Dilated small bowel loops up to 5.1 cm. PO contrast reaches the colon. SOFT TISSUES: No abnormal calcifications. BONES: No acute fracture. IMPRESSION: 1. Dilated small bowel loops up to 5.1 cm, concerning for partial small bowel obstruction with PO contrast reaches the colon. 2. Enteric tube in place. Electronically signed by: Kristin Naveau MD 06/02/2024 10:31 PM EST RP Workstation: [id number]   Scheduled Meds:  anastrozole   1 mg Oral QHS   enoxaparin  (LOVENOX ) injection  40 mg Subcutaneous Q24H   magnesium  citrate  1 Bottle Oral Once   metoprolol  succinate  25 mg Oral Daily   pantoprazole  (PROTONIX ) IV  40 mg Intravenous Q24H   polyethylene glycol  17 g Oral BID   rosuvastatin   5 mg Oral Daily   Continuous Infusions:  magnesium  sulfate bolus IVPB      LOS: 2 days   Time spent: 55  mins  Kristin Mcnear Vicci, MD How to contact the [facility] Attending or Consulting provider 7A - 7P or covering provider during after hours 7P -7A, for this patient?  Check the care team in [facility] and look for a) attending/consulting [facility] provider listed and b) the [facility] team listed Log into [email or web address] to find provider on call.  Locate the [facility] provider you are looking for under [facility] and page to a number that you can be directly reached. If you still have difficulty reaching the provider, please page the [facility] (Director on Call) for the Hospitalists listed on amion for assistance.  06/03/2024, 11:07 AM

## 2024-06-04 LAB — BASIC METABOLIC PANEL WITH GFR
Anion gap: 8 (ref 5–15)
BUN: 15 mg/dL (ref 8–23)
CO2: 27 mmol/L (ref 22–32)
Calcium: 7.8 mg/dL — ABNORMAL LOW (ref 8.9–10.3)
Chloride: 103 mmol/L (ref 98–111)
Creatinine, Ser: 0.56 mg/dL (ref 0.44–1.00)
GFR, Estimated: >60 mL/min (ref >60)
Glucose, Bld: 75 mg/dL (ref 70–99)
Potassium: 4 mmol/L (ref 3.5–5.1)
Sodium: 138 mmol/L (ref 135–145)

## 2024-06-04 LAB — MAGNESIUM: Magnesium: 2 mg/dL (ref 1.7–2.4)

## 2024-06-04 MED ORDER — BISACODYL 5 MG PO TBEC: 5.0000 mg | DELAYED_RELEASE_TABLET | Freq: Every day | ORAL | Status: AC | PRN

## 2024-06-04 MED ORDER — SIMETHICONE 80 MG PO CHEW: 80.0000 mg | CHEWABLE_TABLET | Freq: Four times a day (QID) | ORAL | Status: AC | PRN

## 2024-06-04 NOTE — Progress Notes (Signed)
Patient discharged home with instructions given on medications and follow up visits, patient verbalized understanding. IV discontinued,catheter intact. Accompanied by staff to an awaiting vehicle.. 

## 2024-06-04 NOTE — Discharge Instructions (Signed)
 IMPORTANT INFORMATION: PAY CLOSE ATTENTION   PHYSICIAN DISCHARGE INSTRUCTIONS  Follow with Primary care provider  Kristin Mardy DEL, PA-C  and other consultants as instructed by your Hospitalist Physician  SEEK MEDICAL CARE OR RETURN TO EMERGENCY ROOM IF SYMPTOMS COME BACK, WORSEN OR NEW PROBLEM DEVELOPS   Please note: You were cared for by a hospitalist during your hospital stay. Every effort will be made to forward records to your primary care provider.  You can request that your primary care provider send for your hospital records if they have not received them.  Once you are discharged, your primary care physician will handle any further medical issues. Please note that NO REFILLS for any discharge medications will be authorized once you are discharged, as it is imperative that you return to your primary care physician (or establish a relationship with a primary care physician if you do not have one) for your post hospital discharge needs so that they can reassess your need for medications and monitor your lab values.  Please get a complete blood count and chemistry panel checked by your Primary MD at your next visit, and again as instructed by your Primary MD.  Get Medicines reviewed and adjusted: Please take all your medications with you for your next visit with your Primary MD  Laboratory/radiological data: Please request your Primary MD to go over all hospital tests and procedure/radiological results at the follow up, please ask your primary care provider to get all Hospital records sent to his/her office.  In some cases, they will be blood work, cultures and biopsy results pending at the time of your discharge. Please request that your primary care provider follow up on these results.  If you are diabetic, please bring your blood sugar readings with you to your follow up appointment with primary care.    Please call and make your follow up appointments as soon as possible.    Also  Note the following: If you experience worsening of your admission symptoms, develop shortness of breath, life threatening emergency, suicidal or homicidal thoughts you must seek medical attention immediately by calling 911 or calling your MD immediately  if symptoms less severe.  You must read complete instructions/literature along with all the possible adverse reactions/side effects for all the Medicines you take and that have been prescribed to you. Take any new Medicines after you have completely understood and accpet all the possible adverse reactions/side effects.   Do not drive when taking Pain medications or sleeping medications (Benzodiazepines)  Do not take more than prescribed Pain, Sleep and Anxiety Medications. It is not advisable to combine anxiety,sleep and pain medications without talking with your primary care practitioner  Special Instructions: If you have smoked or chewed Tobacco  in the last 2 yrs please stop smoking, stop any regular Alcohol  and or any Recreational drug use.  Wear Seat belts while driving.  Do not drive if taking any narcotic, mind altering or controlled substances or recreational drugs or alcohol.

## 2024-06-04 NOTE — Progress Notes (Signed)
 Nurse at bedside,patient alert and oriented times four.No c/o pain or discomfort noted .Blood pressure 132/54,heart rate 92,Dr Clanford Johnson notified.Plan of care on going.

## 2024-06-04 NOTE — Plan of Care (Signed)
  Problem: Education: Goal: Knowledge of General Education information will improve Description: Including pain rating scale, medication(s)/side effects and non-pharmacologic comfort measures Outcome: Progressing   Problem: Clinical Measurements: Goal: Ability to maintain clinical measurements within normal limits will improve Outcome: Progressing Goal: Will remain free from infection Outcome: Progressing   Problem: Elimination: Goal: Will not experience complications related to bowel motility Outcome: Progressing Goal: Will not experience complications related to urinary retention Outcome: Progressing   Problem: Pain Managment: Goal: General experience of comfort will improve and/or be controlled Outcome: Progressing   Problem: Safety: Goal: Ability to remain free from injury will improve Outcome: Progressing   Problem: Skin Integrity: Goal: Risk for impaired skin integrity will decrease Outcome: Progressing

## 2024-06-04 NOTE — Progress Notes (Signed)
  Subjective: Bowel function has returned.  Patient would like to go home.  Objective: Vital signs in last 24 hours: Temp:  [98 F (36.7 C)-99 F (37.2 C)] 99 F (37.2 C) (12/09 0913) Pulse Rate:  [88-99] 92 (12/09 0913) Resp:  [16-20] 18 (12/09 0913) BP: (116-132)/(53-62) 132/54 (12/09 0913) SpO2:  [92 %-97 %] 97 % (12/09 0913) Last BM Date : 06/03/24  Intake/Output from previous day: 12/08 0701 - 12/09 0700 In: 162.2 [P.O.:120; IV Piggyback:42.2] Out: -  Intake/Output this shift: No intake/output data recorded.  General appearance: alert, cooperative, and no distress GI: soft, non-tender; bowel sounds normal; no masses,  no organomegaly  Lab Results:  Recent Labs    06/02/24 0506 06/03/24 0437  WBC 6.7 9.3  HGB 12.3 13.0  HCT 37.3 39.1  PLT 221 267   BMET Recent Labs    06/03/24 0437 06/04/24 0515  NA 140 138  K 4.3 4.0  CL 102 103  CO2 28 27  GLUCOSE 71 75  BUN 18 15  CREATININE 0.58 0.56  CALCIUM  8.4* 7.8*   PT/INR No results for input(s): LABPROT, INR in the last 72 hours.  Studies/Results: DG Abd Portable 1V-Small Bowel Obstruction Protocol-initial, 8 hr delay Result Date: 06/02/2024 EXAM: 1 VIEW XRAY OF THE ABDOMEN 06/02/2024 08:48:43 PM COMPARISON: CT abdomen/pelvis 05/31/2024. CLINICAL HISTORY: FINDINGS: LINES, TUBES AND DEVICES: Enteric tube in place with tip in gastric fundus. BOWEL: Dilated small bowel loops up to 5.1 cm. PO contrast reaches the colon. SOFT TISSUES: No abnormal calcifications. BONES: No acute fracture. IMPRESSION: 1. Dilated small bowel loops up to 5.1 cm, concerning for partial small bowel obstruction with PO contrast reaches the colon. 2. Enteric tube in place. Electronically signed by: Morgane Naveau MD 06/02/2024 10:31 PM EST RP Workstation: [id number]    Anti-infectives: Anti-infectives (From admission, onward)    None       Assessment/Plan: Impression: Partial small bowel obstruction resolved.  No need for  surgical intervention at the present time. Plan: Okay for discharge from surgery standpoint.  This was discussed with the patient and Dr. Vicci.  LOS: 3 days    Oneil Budge 06/04/2024

## 2024-06-04 NOTE — Discharge Summary (Signed)
 Physician Discharge Summary  Kristin Villa FMW:[id number] DOB: 1960-10-22 DOA: 06/01/2024  PCP: Vida Mardy DEL, PA-C  Admit date: 06/01/2024 Discharge date: 06/04/2024  Admitted From:  Home  Disposition: Home   Recommendations for Outpatient Follow-up:  Follow up with PCP in 1 weeks Please follow up with GI in 1-2 weeks   Discharge Condition: STABLE   CODE STATUS: FULL DIET: soft foods, advance as tolerated    Brief Hospitalization Summary: Please see all hospital notes, images, labs for full details of the hospitalization. Admission provider HPI:  63 year old female with past medical history of left renal cell carcinoma status post surgical partial left nephrectomy, history of breast cancer status postmastectomy and IV chemotherapy, Clark's level 4 superficial spreading melanoma, hyperlipidemia, hypertension, status post partial hysterectomy at age 69, that is post simple left mastectomy, modified radical right mastectomy who presented to the emergency department complaining of abdominal pain.  She recently saw her gastroenterologist and they sent her for CT scan on 05/31/2024 which did reveal a small bowel obstruction.  She was sent to the emergency department for further management.  On arrival she noted that her symptoms started in early October with abdominal pain and bloating which has progressively worsened.  She feels bloated in the abdomen with cramping.  She has nausea vomiting symptoms.  Last BM was 05/28/24  She is not having flatus and has not had for the past few days.  She is having difficulty eating due to abdominal pain.  Her CT scan demonstrated  partial or early high-grade small bowel obstruction, transition point in the central pelvis.  She had a potassium of 5.4, glucose 122, alkaline phosphatase 130, AST 43, ALT 53, total protein 5.8, bilirubin 0.8, WBC 10.6, hemoglobin 14.9 and platelet count 294.  NG tube was placed for decompression and Dr. Mavis with surgery team  consulted and hospital admission requested for further management.  Hospital Course by listed problems  Assessment and Plan:   Partial SBO-- RESOLVED  -- pt symptomatic on presentation but improved now with supportive care  -- NG tube placement for bowel decompression -- NG removed by surgeon 06/03/24 -- continue supportive measures -- diet advanced and tolerated and patient having multiple bowel movements  -- appreciate general surgery team consultation -- small bowel follow thru study reassuring with contrast reaching the colon   Hyperkalemia (mild)  -- resolved  -- IV lasix  30 mg x 1 dose ordered -- resolved    Generalized abdominal pain--RESOLVED  -- improvement after NG placement to suction for decompression -- had large BM today    Essential hypertension  -- resume home meds   History of left renal cell carcinoma -- s/p partial left nephrectomy -- no recurrence of soft tissue or mass reported in the postop bed   History of breast cancer -- s/p bilateral mastectomy -- s/p chemotherapy    Hyperlipidemia -- resumed home rosuvastatin  5 mg     Discharge Diagnoses:  Principal Problem:   SBO (small bowel obstruction) (HCC) Active Problems:   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   bisacodyl  5 MG EC tablet Commonly known as: DULCOLAX Take 1 tablet (  5 mg total) by mouth daily as needed for moderate constipation.   cholecalciferol 25 MCG (1000 UNIT) tablet Commonly known as: VITAMIN D3 Take 1,000 Units by mouth daily.   cyclobenzaprine  10 MG tablet Commonly known as: FLEXERIL  Take 5 mg by mouth 3 (three) times daily as needed for muscle spasms.    metoprolol  succinate 25 MG 24 hr tablet Commonly known as: TOPROL -XL Take 25 mg by mouth daily.   naproxen sodium 220 MG tablet Commonly known as: ALEVE Take 220 mg by mouth every 12 (twelve) hours.   ondansetron  4 MG disintegrating tablet Commonly known as: ZOFRAN -ODT Take 1 tablet (4 mg total) by mouth every 8 (eight) hours as needed for nausea or vomiting.   rosuvastatin  5 MG tablet Commonly known as: CRESTOR  Take 5 mg by mouth daily.   simethicone  80 MG chewable tablet Commonly known as: MYLICON Chew 1 tablet (80 mg total) by mouth every 6 (six) hours as needed for flatulence.        Follow-up Information     Vida Mardy DEL, PA-C. Schedule an appointment as soon as possible for a visit in 1 week(s).   Specialty: Physician Assistant Why: Hospital Follow Up Contact information: [address] Myrna Hensen [place] [address] [phone]                No Active Allergies Allergies as of 06/04/2024   No Active Allergies      Medication List     TAKE these medications    anastrozole  1 MG tablet Commonly known as: ARIMIDEX  Take 1 mg by mouth daily.   bisacodyl  5 MG EC tablet Commonly known as: DULCOLAX Take 1 tablet (5 mg total) by mouth daily as needed for moderate constipation.   cholecalciferol 25 MCG (1000 UNIT) tablet Commonly known as: VITAMIN D3 Take 1,000 Units by mouth daily.   cyclobenzaprine  10 MG tablet Commonly known as: FLEXERIL  Take 5 mg by mouth 3 (three) times daily as needed for muscle spasms.   metoprolol  succinate 25 MG 24 hr tablet Commonly known as: TOPROL -XL Take 25 mg by mouth daily.   naproxen sodium 220 MG tablet Commonly known as: ALEVE Take 220 mg by mouth every 12 (twelve) hours.   ondansetron  4 MG disintegrating tablet Commonly known as: ZOFRAN -ODT Take 1 tablet (4 mg total) by mouth every 8 (eight) hours as needed for nausea or vomiting.   rosuvastatin  5 MG tablet Commonly known as: CRESTOR  Take 5 mg by mouth daily.    simethicone  80 MG chewable tablet Commonly known as: MYLICON Chew 1 tablet (80 mg total) by mouth every 6 (six) hours as needed for flatulence.        Procedures/Studies: DG Abd Portable 1V-Small Bowel Obstruction Protocol-initial, 8 hr delay Result Date: 06/02/2024 EXAM: 1 VIEW XRAY OF THE ABDOMEN 06/02/2024 08:48:43 PM COMPARISON: CT abdomen/pelvis 05/31/2024. CLINICAL HISTORY: FINDINGS: LINES, TUBES AND DEVICES: Enteric tube in place with tip in gastric fundus. BOWEL: Dilated small bowel loops up to 5.1 cm. PO contrast reaches the colon. SOFT TISSUES: No abnormal calcifications. BONES: No acute fracture. IMPRESSION: 1. Dilated small bowel loops up to 5.1 cm, concerning for partial small bowel obstruction with PO contrast reaches the colon. 2. Enteric tube in place. Electronically signed by: Morgane Naveau MD 06/02/2024 10:31 PM EST RP Workstation: [id number]   DG Chest Portable 1 View Result Date: 06/01/2024 CLINICAL DATA:  Nasogastric tube placement verification. EXAM: PORTABLE CHEST 1 VIEW COMPARISON:  05/07/2021 FINDINGS: Nasogastric tube extends down the esophagus and  the tip is in the left upper quadrant of the abdomen. The side hole is near the GE junction. Nasogastric tube tip is in the proximal stomach. Mild elevation of the right hemidiaphragm. Probable atelectasis in the right mid lung. Otherwise, the lungs are clear. Heart size is normal. Atherosclerotic calcifications at the aortic arch. IMPRESSION: 1. Nasogastric tube tip is in the proximal stomach. Electronically Signed   By: Juliene Balder M.D.   On: 06/01/2024 10:34   CT ABDOMEN PELVIS W CONTRAST Result Date: 05/31/2024 CLINICAL DATA:  Small-bowel obstruction EXAM: CT ABDOMEN AND PELVIS WITH CONTRAST TECHNIQUE: Multidetector CT imaging of the abdomen and pelvis was performed using the standard protocol following bolus administration of intravenous contrast. RADIATION DOSE REDUCTION: This exam was performed according to the  departmental dose-optimization program which includes automated exposure control, adjustment of the mA and/or kV according to patient size and/or use of iterative reconstruction technique. CONTRAST:  OMNIPAQUE  IOHEXOL  300 MG/ML  SOLN COMPARISON:  03/26/2024, 02/20/2023 FINDINGS: Lower chest: No focal airspace consolidation or pleural effusion. Posterior bibasilar dependent atelectasis. Hepatobiliary: No mass.Diffuse hepatic steatosis.No radiopaque stones or wall thickening of the gallbladder. No intrahepatic or extrahepatic biliary ductal dilation. The portal veins are patent. Pancreas: No mass or main ductal dilation. No peripancreatic inflammation or fluid collection. 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 Adrenals/Urinary Tract: Unchanged 1.8 cm left adrenal nodule, consistent with an adrenal adenoma. Partial left nephrectomy. No renal mass. No nephrolithiasis or hydronephrosis. The urinary bladder is distended without focal abnormality. Stomach/Bowel: The stomach contains ingested material without focal abnormality. Abnormal fluid-filled dilation of multiple segments of small bowel extending into the right lower quadrant, where there is an abrupt transition point in the central pelvis (axial 61-54, coronal 85).The majority of the distal ileum is completely decompressed as is the majority of the colon.Normal appendix. Mild wall thickening of the small bowel at the transition point. Vascular/Lymphatic: No aortic aneurysm. No intraabdominal or pelvic lymphadenopathy. Reproductive: Hysterectomy. No concerning adnexal mass. Other: No pneumoperitoneum.  Small volume ascites. Musculoskeletal: No acute fracture or destructive lesion. Multilevel degenerative disc disease of the spine. IMPRESSION: 1. Findings consistent with either a partial or early high-grade small bowel obstruction, transition point in the central pelvis. This may be due to underlying adhesions or  focal enteritis at the transition point (coronal 85). Small volume ascites is likely reactive. 2. In the spleen, there is a round arterially enhancing lesion measuring 8 mm, likely a benign hemangioma. Attention on follow-up imaging is recommended document stability. 3. Postsurgical changes consistent with prior nephrectomy. No recurrent soft tissue or mass in the operative bed. These results will be called to the ordering clinician or representative by the Radiologist Assistant and communication documented in the PACS or [employer]. Electronically Signed   By: Rogelia Myers M.D.   On: 05/31/2024 15:07     Subjective: Pt having multiple bowel movements, tolerating diet and eager to go home.   Discharge Exam: Vitals:   06/04/24 0634 06/04/24 0913  BP: 121/62 (!) 132/54  Pulse: 90 92  Resp: 18 18  Temp: 98 F (36.7 C) 99 F (37.2 C)  SpO2: 97% 97%   Vitals:   06/03/24 1148 06/03/24 2003 06/04/24 0634 06/04/24 0913  BP: 119/60 (!) 116/53 121/62 (!) 132/54  Pulse: 99 88 90 92  Resp: 20 16 18 18   Temp: 98.2 F (36.8 C) 98.7 F (37.1 C) 98 F (36.7 C) 99 F (37.2 C)  TempSrc:  Oral Oral Oral Oral  SpO2: 95% 92% 97% 97%  Weight:      Height:        General: Pt is alert, awake, not in acute distress Cardiovascular: RRR, S1/S2 +, no rubs, no gallops Respiratory: CTA bilaterally, no wheezing, no rhonchi Abdominal: Soft, NT, ND, bowel sounds + Extremities: no edema, no cyanosis   The results of significant diagnostics from this hospitalization (including imaging, microbiology, ancillary and laboratory) are listed below for reference.     Microbiology: No results found for this or any previous visit (from the past 240 hours).   Labs: BNP (last 3 results) No results for input(s): BNP in the last 8760 hours. Basic Metabolic Panel: Recent Labs  Lab 06/01/24 0824 06/02/24 0500 06/02/24 0506 06/03/24 0437 06/04/24 0515  NA 139  --  139 140 138  K 5.4*  --  3.9 4.3 4.0   CL 100  --  102 102 103  CO2 31  --  31 28 27   GLUCOSE 122*  --  78 71 75  BUN 16  --  16 18 15   CREATININE 0.62  --  0.64 0.58 0.56  CALCIUM  9.4  --  8.3* 8.4* 7.8*  MG  --  1.7  --  1.8 2.0   Liver Function Tests: Recent Labs  Lab 06/01/24 0824 06/02/24 0506 06/03/24 0437  AST 43* 26 28  ALT 53* 34 33  ALKPHOS 130* 96 112  BILITOT 0.8 0.6 0.6  PROT 5.8* 4.4* 4.8*  ALBUMIN 3.6 2.8* 3.0*   Recent Labs  Lab 06/01/24 0824  LIPASE 12   No results for input(s): AMMONIA in the last 168 hours. CBC: Recent Labs  Lab 06/01/24 0824 06/02/24 0506 06/03/24 0437  WBC 10.6* 6.7 9.3  HGB 14.9 12.3 13.0  HCT 45.7 37.3 39.1  MCV 97.0 96.9 97.0  PLT 294 221 267   Cardiac Enzymes: No results for input(s): CKTOTAL, CKMB, CKMBINDEX, TROPONINI in the last 168 hours. BNP: Invalid input(s): POCBNP CBG: No results for input(s): GLUCAP in the last 168 hours. D-Dimer No results for input(s): DDIMER in the last 72 hours. Hgb A1c No results for input(s): HGBA1C in the last 72 hours. Lipid Profile No results for input(s): CHOL, HDL, LDLCALC, TRIG, CHOLHDL, LDLDIRECT in the last 72 hours. Thyroid function studies No results for input(s): TSH, T4TOTAL, T3FREE, THYROIDAB in the last 72 hours.  Invalid input(s): FREET3 Anemia work up No results for input(s): VITAMINB12, FOLATE, FERRITIN, TIBC, IRON, RETICCTPCT in the last 72 hours. Urinalysis    Component Value Date/Time   COLORURINE AMBER (A) 06/01/2024 0814   APPEARANCEUR HAZY (A) 06/01/2024 0814   LABSPEC 1.044 (H) 06/01/2024 0814   PHURINE 5.0 06/01/2024 0814   GLUCOSEU NEGATIVE 06/01/2024 0814   HGBUR NEGATIVE 06/01/2024 0814   BILIRUBINUR NEGATIVE 06/01/2024 0814   KETONESUR 20 (A) 06/01/2024 0814   PROTEINUR 30 (A) 06/01/2024 0814   UROBILINOGEN 0.2 01/09/2007 1103   NITRITE NEGATIVE 06/01/2024 0814   LEUKOCYTESUR NEGATIVE 06/01/2024 0814   Sepsis Labs Recent Labs   Lab 06/01/24 0824 06/02/24 0506 06/03/24 0437  WBC 10.6* 6.7 9.3   Microbiology No results found for this or any previous visit (from the past 240 hours).  Time coordinating discharge: 31 mins  SIGNED:  Afton Louder, MD  [facility] 06/04/2024, 9:39 AM How to contact the [facility] Attending or Consulting provider 7A - 7P or covering provider during after hours 7P -7A, for this patient?  Check the care team  in [facility] and look for a) attending/consulting [facility] provider listed and b) the [facility] team listed Log into [email or web address] and use [place]'s universal password to access. If you do not have the password, please contact the hospital operator. Locate the [facility] provider you are looking for under [facility] and page to a number that you can be directly reached. If you still have difficulty reaching the provider, please page the [facility] (Director on Call) for the Hospitalists listed on amion for assistance.

## 2024-06-05 LAB — HIV ANTIBODY (ROUTINE TESTING W REFLEX): HIV Screen 4th Generation wRfx: NONREACTIVE

## 2024-06-17 ENCOUNTER — Ambulatory Visit (INDEPENDENT_AMBULATORY_CARE_PROVIDER_SITE_OTHER): Admitting: Gastroenterology
